# Patient Record
Sex: Female | Born: 1960 | Race: White | Hispanic: No | Marital: Married | Smoking: Never smoker
Health system: Southern US, Community
[De-identification: ages and names within clinical notes are randomized; demographics above are authoritative.]

## PROBLEM LIST (undated history)

## (undated) DIAGNOSIS — L409 Psoriasis, unspecified: Secondary | ICD-10-CM

## (undated) DIAGNOSIS — H32 Chorioretinal disorders in diseases classified elsewhere: Secondary | ICD-10-CM

## (undated) DIAGNOSIS — H8101 Meniere's disease, right ear: Secondary | ICD-10-CM

## (undated) DIAGNOSIS — B399 Histoplasmosis, unspecified: Secondary | ICD-10-CM

## (undated) DIAGNOSIS — H36 Retinal disorders in diseases classified elsewhere: Secondary | ICD-10-CM

## (undated) DIAGNOSIS — Z87442 Personal history of urinary calculi: Secondary | ICD-10-CM

## (undated) DIAGNOSIS — E119 Type 2 diabetes mellitus without complications: Secondary | ICD-10-CM

## (undated) DIAGNOSIS — G43909 Migraine, unspecified, not intractable, without status migrainosus: Secondary | ICD-10-CM

## (undated) DIAGNOSIS — C4490 Unspecified malignant neoplasm of skin, unspecified: Secondary | ICD-10-CM

## (undated) DIAGNOSIS — H3689 Other retinal disorders in diseases classified elsewhere: Secondary | ICD-10-CM

## (undated) DIAGNOSIS — K862 Cyst of pancreas: Secondary | ICD-10-CM

## (undated) HISTORY — DX: Migraine, unspecified, not intractable, without status migrainosus: G43.909

## (undated) HISTORY — DX: Psoriasis, unspecified: L40.9

## (undated) HISTORY — DX: Retinal disorders in diseases classified elsewhere: H36

## (undated) HISTORY — DX: Other retinal disorders in diseases classified elsewhere: H36.89

## (undated) HISTORY — DX: Histoplasmosis, unspecified: B39.9

## (undated) HISTORY — DX: Unspecified malignant neoplasm of skin, unspecified: C44.90

## (undated) HISTORY — DX: Chorioretinal disorders in diseases classified elsewhere: H32

## (undated) HISTORY — DX: Cyst of pancreas: K86.2

## (undated) HISTORY — DX: Personal history of urinary calculi: Z87.442

## (undated) HISTORY — DX: Meniere's disease, right ear: H81.01

## (undated) HISTORY — DX: Type 2 diabetes mellitus without complications: E11.9

---

## 2003-09-20 HISTORY — PX: TONSILLECTOMY: SUR1361

## 2005-03-31 ENCOUNTER — Ambulatory Visit: Payer: Self-pay | Admitting: Unknown Physician Specialty

## 2005-04-11 ENCOUNTER — Ambulatory Visit: Payer: Self-pay | Admitting: Unknown Physician Specialty

## 2006-05-29 ENCOUNTER — Ambulatory Visit: Payer: Self-pay | Admitting: Unknown Physician Specialty

## 2007-03-20 ENCOUNTER — Ambulatory Visit: Payer: Self-pay | Admitting: Unknown Physician Specialty

## 2007-06-18 ENCOUNTER — Ambulatory Visit: Payer: Self-pay | Admitting: Unknown Physician Specialty

## 2008-01-18 HISTORY — PX: DENTAL SURGERY: SHX609

## 2008-07-21 ENCOUNTER — Ambulatory Visit: Payer: Self-pay | Admitting: Unknown Physician Specialty

## 2009-07-27 ENCOUNTER — Ambulatory Visit: Payer: Self-pay | Admitting: Unknown Physician Specialty

## 2009-09-19 HISTORY — PX: DENTAL SURGERY: SHX609

## 2009-11-05 ENCOUNTER — Ambulatory Visit: Payer: Self-pay | Admitting: Internal Medicine

## 2009-12-01 ENCOUNTER — Ambulatory Visit: Payer: Self-pay | Admitting: Otolaryngology

## 2010-08-02 ENCOUNTER — Ambulatory Visit: Payer: Self-pay | Admitting: Unknown Physician Specialty

## 2010-08-05 ENCOUNTER — Ambulatory Visit: Payer: Self-pay | Admitting: Unknown Physician Specialty

## 2011-03-01 ENCOUNTER — Ambulatory Visit: Payer: Self-pay | Admitting: Unknown Physician Specialty

## 2011-04-21 ENCOUNTER — Ambulatory Visit: Payer: Self-pay | Admitting: Unknown Physician Specialty

## 2011-09-28 ENCOUNTER — Ambulatory Visit: Payer: Self-pay | Admitting: Unknown Physician Specialty

## 2011-10-25 ENCOUNTER — Ambulatory Visit: Payer: Self-pay | Admitting: Unknown Physician Specialty

## 2011-12-02 ENCOUNTER — Ambulatory Visit: Payer: Self-pay | Admitting: Gastroenterology

## 2012-06-02 IMAGING — US US RENAL KIDNEY
1 series · 17 of 25 positions shown · non-contrast
Comparison: none

REASON FOR EXAM: FU from ct      showed  cyst on rt kidney
COMMENTS:

[Series 1: us renal kidney · 17 of 31 slices shown]
[im 1/31]
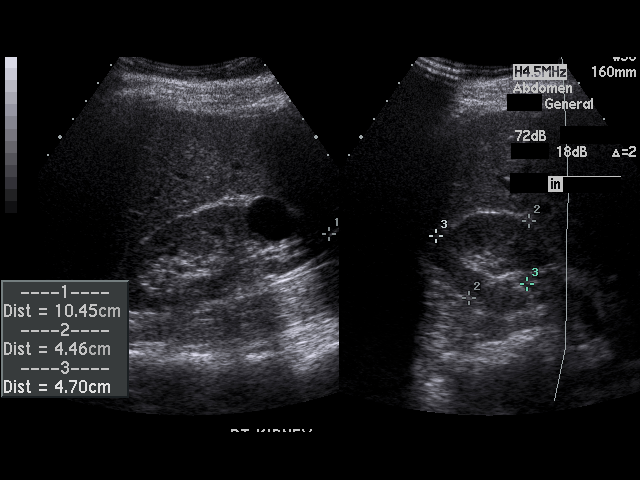
[im 3/31]
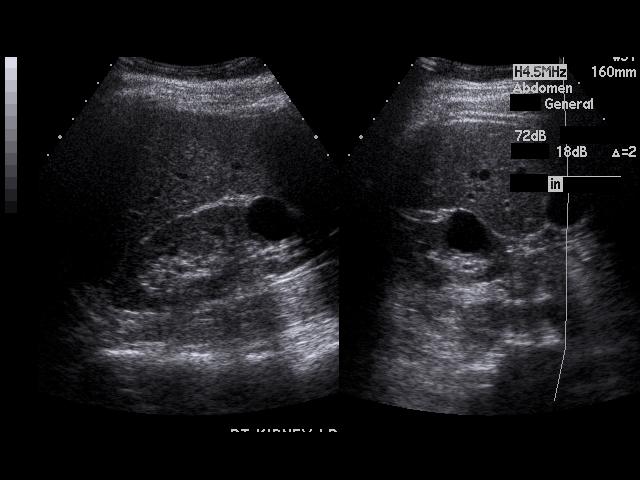
[im 4/31]
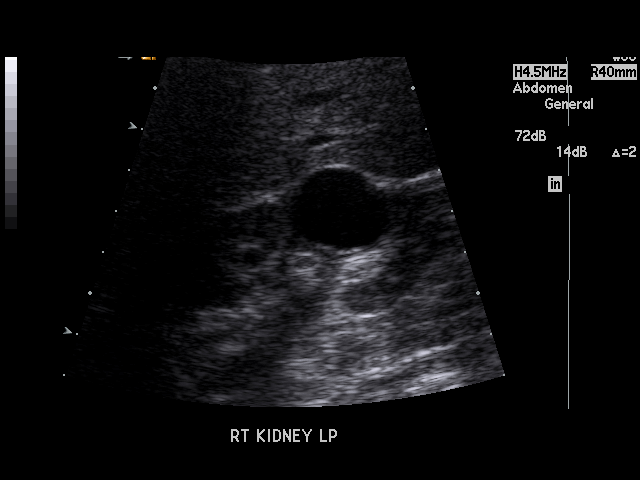
[im 7/31]
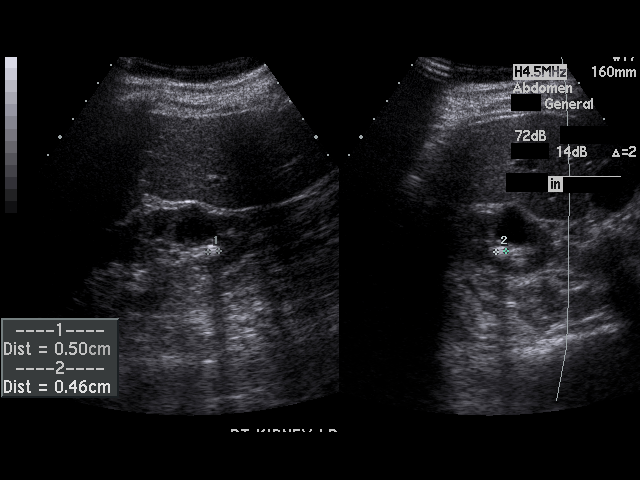
[im 8/31]
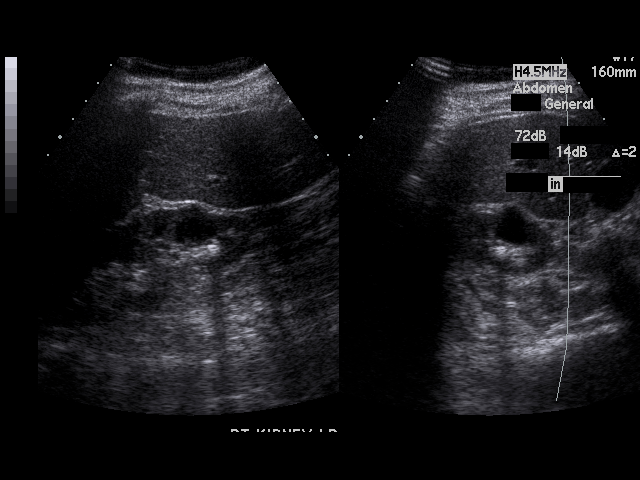
[im 11/31]
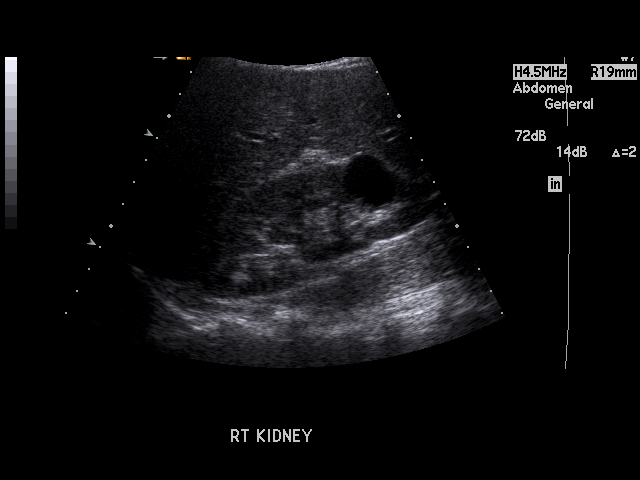
[im 12/31]
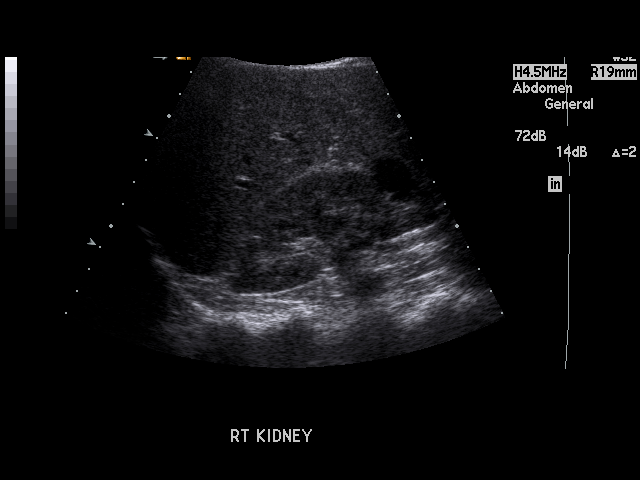
[im 14/31]
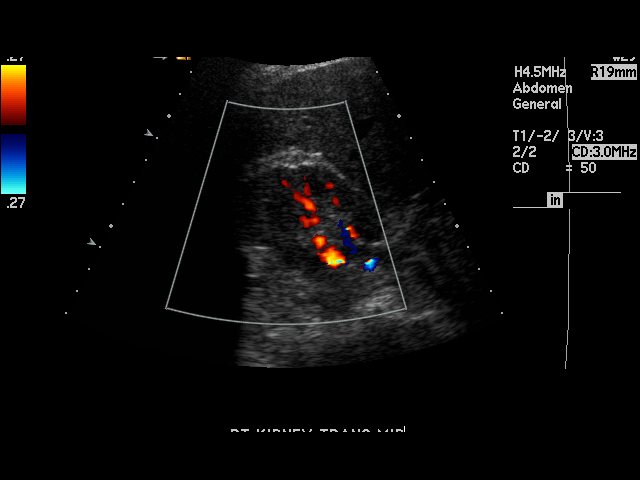
[im 16/31]
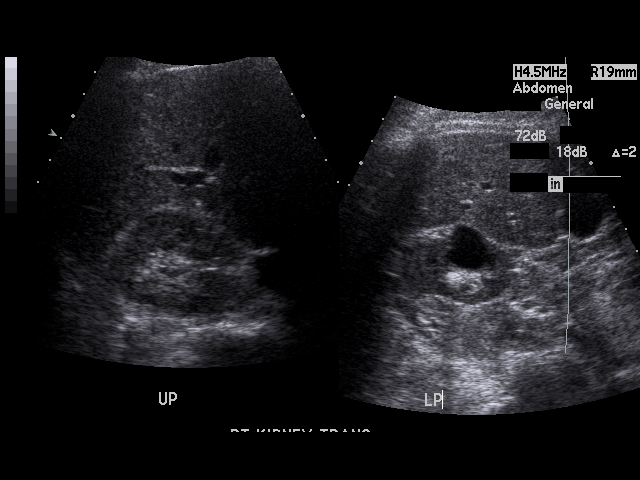
[im 17/31]
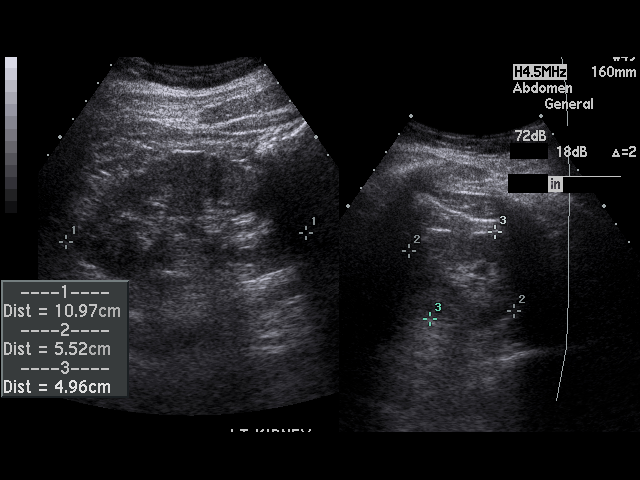
[im 19/31]
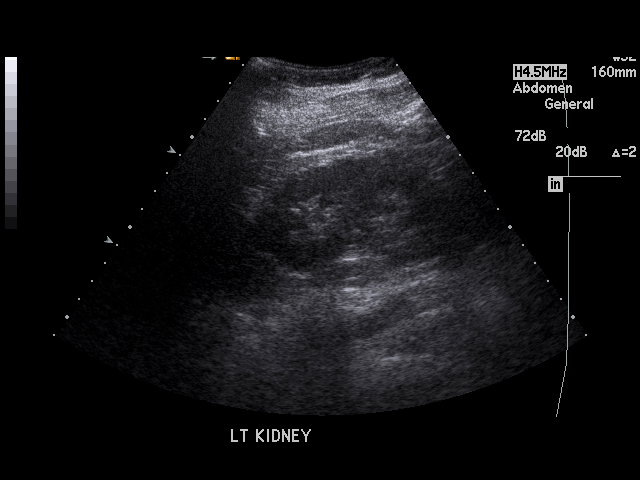
[im 21/31]
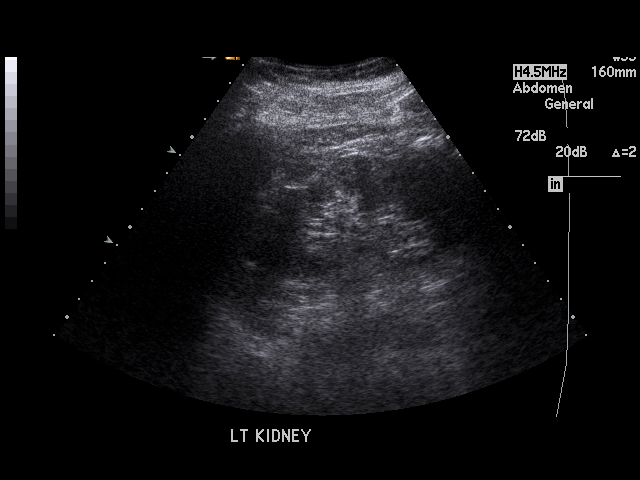
[im 23/31]
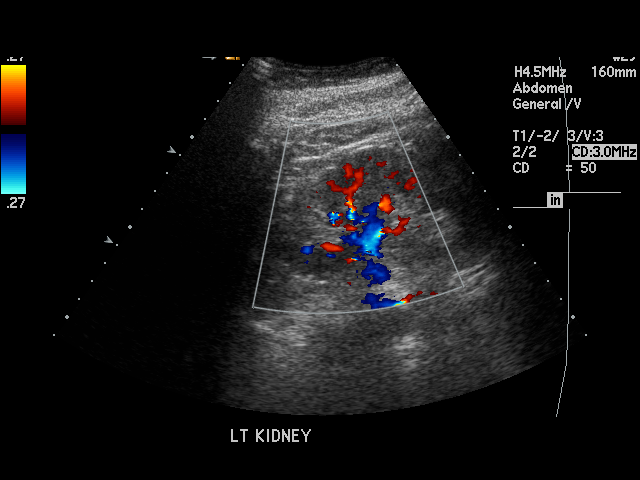
[im 24/31]
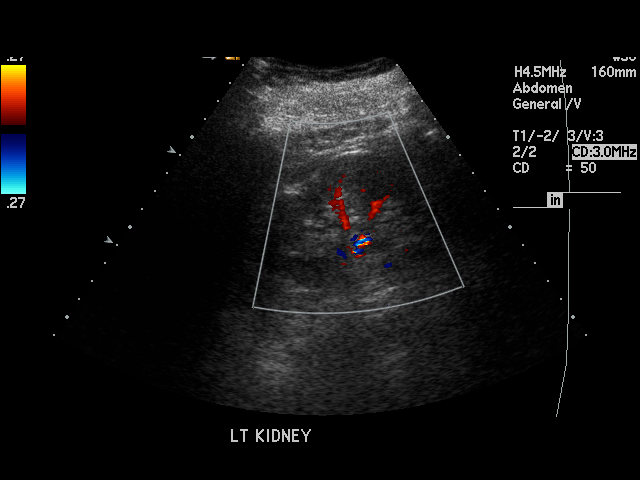
[im 27/31]
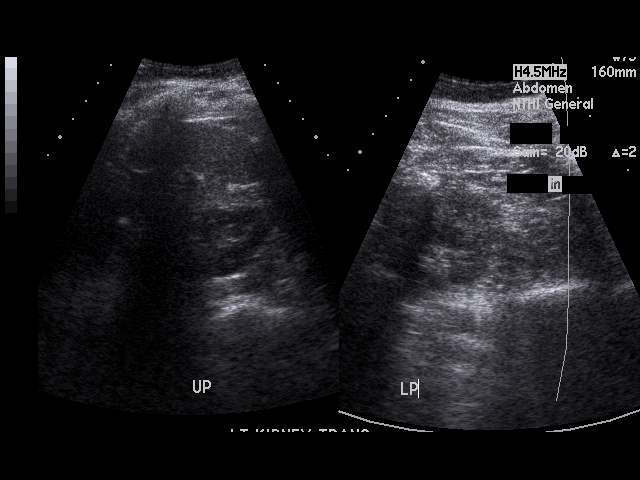
[im 28/31]
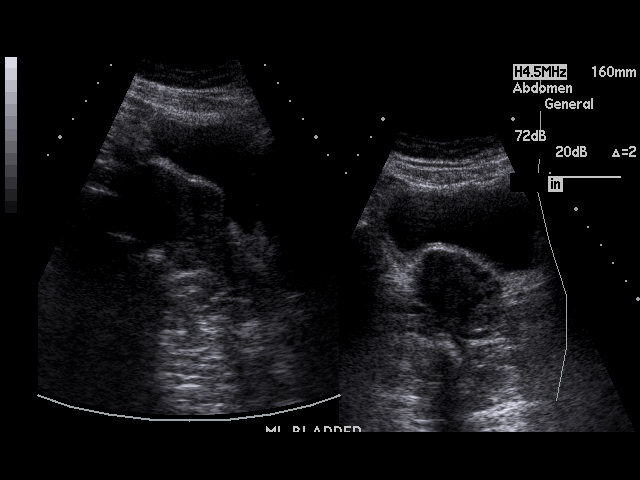
[im 31/31]
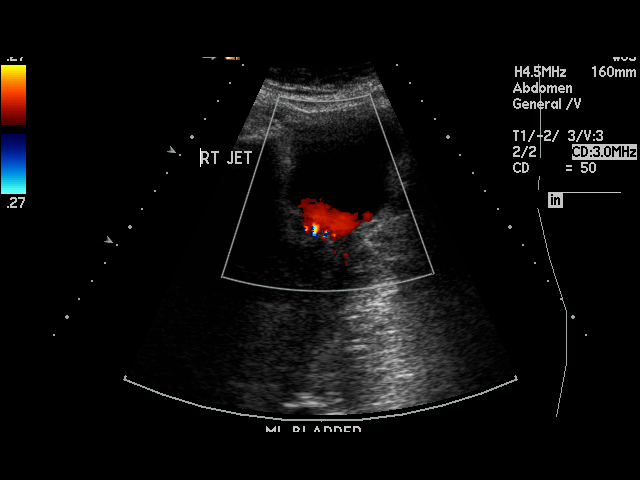

[17 of 25 positions shown; findings below may reference images not displayed]

PROCEDURE:     [facility]  - October 25, 2011  [DATE]

RESULT:     The right kidney measures 10.45 cm x 4.46 cm x 4.7 cm and the
left kidney measures 10.97 cm x 5.52 cm x 4.96 cm. The renal cortical
margins bilaterally are smooth. There is a 2.4 cm cyst at the lower pole of
the right kidney corresponding to the low-attenuating mass observed at prior
CT. No other solid or cystic mass lesions are identified. There is noted a
nonobstructing 5 mm stone at the lower pole of the right kidney. No
hydronephrosis is observed on either side. The visualized portion of the
urinary bladder is normal in appearance. Bilateral ureteral flow jets are
seen.
IMPRESSION: 1. There is a 2.4 cm cyst at the lower pole of the right kidney.
2. There is a 5 mm nonobstructing stone at the lower pole of the right
kidney.
3. Otherwise normal study.

## 2012-07-02 ENCOUNTER — Ambulatory Visit: Payer: Self-pay | Admitting: Family Medicine

## 2012-10-03 ENCOUNTER — Ambulatory Visit: Payer: Self-pay | Admitting: Family Medicine

## 2013-01-01 ENCOUNTER — Ambulatory Visit: Payer: Self-pay | Admitting: Otolaryngology

## 2013-02-18 ENCOUNTER — Ambulatory Visit: Payer: Self-pay

## 2013-02-18 ENCOUNTER — Emergency Department: Payer: Self-pay | Admitting: Emergency Medicine

## 2013-02-18 LAB — COMPREHENSIVE METABOLIC PANEL
Albumin: 4 g/dL (ref 3.4–5.0)
Alkaline Phosphatase: 118 U/L (ref 50–136)
BUN: 18 mg/dL (ref 7–18)
Calcium, Total: 9 mg/dL (ref 8.5–10.1)
Chloride: 102 mmol/L (ref 98–107)
Co2: 30 mmol/L (ref 21–32)
Creatinine: 0.75 mg/dL (ref 0.60–1.30)
EGFR (African American): >60
EGFR (Non-African Amer.): >60
Glucose: 126 mg/dL — ABNORMAL HIGH (ref 65–99)
Osmolality: 279 (ref 275–301)
SGPT (ALT): 27 U/L (ref 12–78)
Sodium: 138 mmol/L (ref 136–145)

## 2013-02-18 LAB — URINALYSIS, COMPLETE
Bilirubin,UR: NEGATIVE
Ketone: NEGATIVE
Ketone: NEGATIVE
Leukocyte Esterase: NEGATIVE
Nitrite: NEGATIVE
Nitrite: POSITIVE
Ph: 5 (ref 4.5–8.0)
Protein: 30
Protein: NEGATIVE
RBC,UR: 101 /HPF (ref 0–5)
Specific Gravity: 1.012 (ref 1.003–1.030)
Squamous Epithelial: NONE SEEN
WBC UR: 5 /HPF (ref 0–5)

## 2013-02-18 LAB — CBC
MCH: 30.2 pg (ref 26.0–34.0)
MCHC: 33.2 g/dL (ref 32.0–36.0)
MCV: 91 fL (ref 80–100)
RDW: 13.3 % (ref 11.5–14.5)
WBC: 8.4 10*3/uL (ref 3.6–11.0)

## 2013-09-02 ENCOUNTER — Ambulatory Visit: Payer: Self-pay | Admitting: Family Medicine

## 2013-09-02 DIAGNOSIS — R21 Rash and other nonspecific skin eruption: Secondary | ICD-10-CM | POA: Insufficient documentation

## 2013-10-08 ENCOUNTER — Ambulatory Visit: Payer: Self-pay | Admitting: Family Medicine

## 2013-10-08 ENCOUNTER — Other Ambulatory Visit: Payer: Self-pay | Admitting: *Deleted

## 2013-10-08 DIAGNOSIS — M79609 Pain in unspecified limb: Secondary | ICD-10-CM

## 2013-10-08 DIAGNOSIS — I83893 Varicose veins of bilateral lower extremities with other complications: Secondary | ICD-10-CM

## 2013-10-22 ENCOUNTER — Encounter (HOSPITAL_COMMUNITY): Payer: BC Managed Care – PPO

## 2013-10-22 ENCOUNTER — Encounter: Payer: BC Managed Care – PPO | Admitting: Vascular Surgery

## 2014-05-17 IMAGING — MG MM DIGITAL SCREENING BILAT W/ CAD
1 series · 4 of 4 positions shown · non-contrast
Comparison: Previous exam(s).

CLINICAL DATA: Screening.

EXAM:
DIGITAL SCREENING BILATERAL MAMMOGRAM WITH CAD

[R CC · right · 4 of 4 slices shown]
[im 1/4]
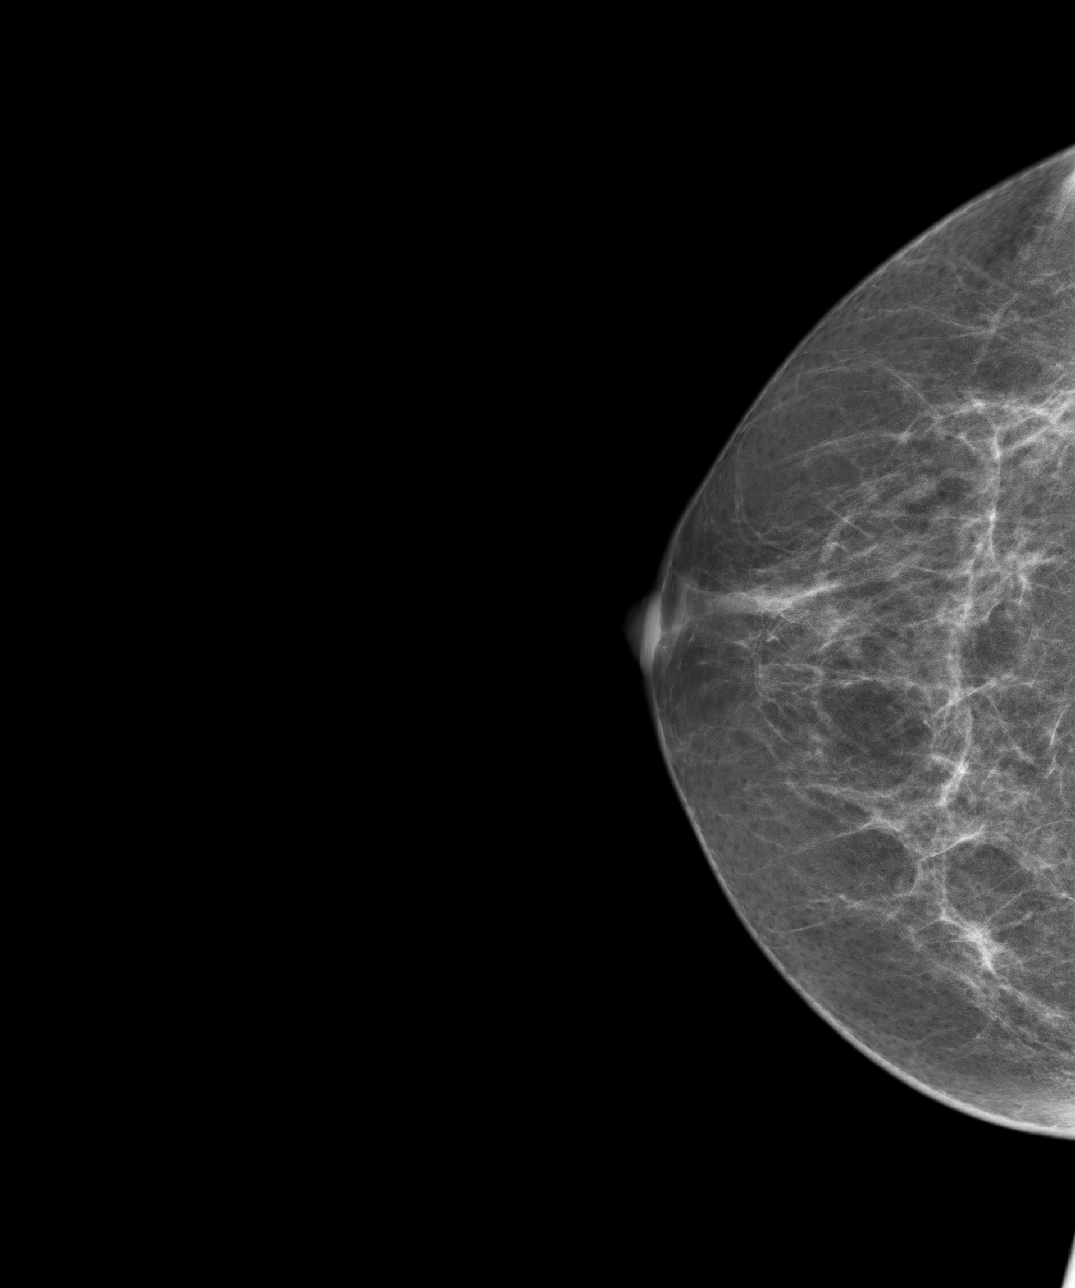
[im 2/4]
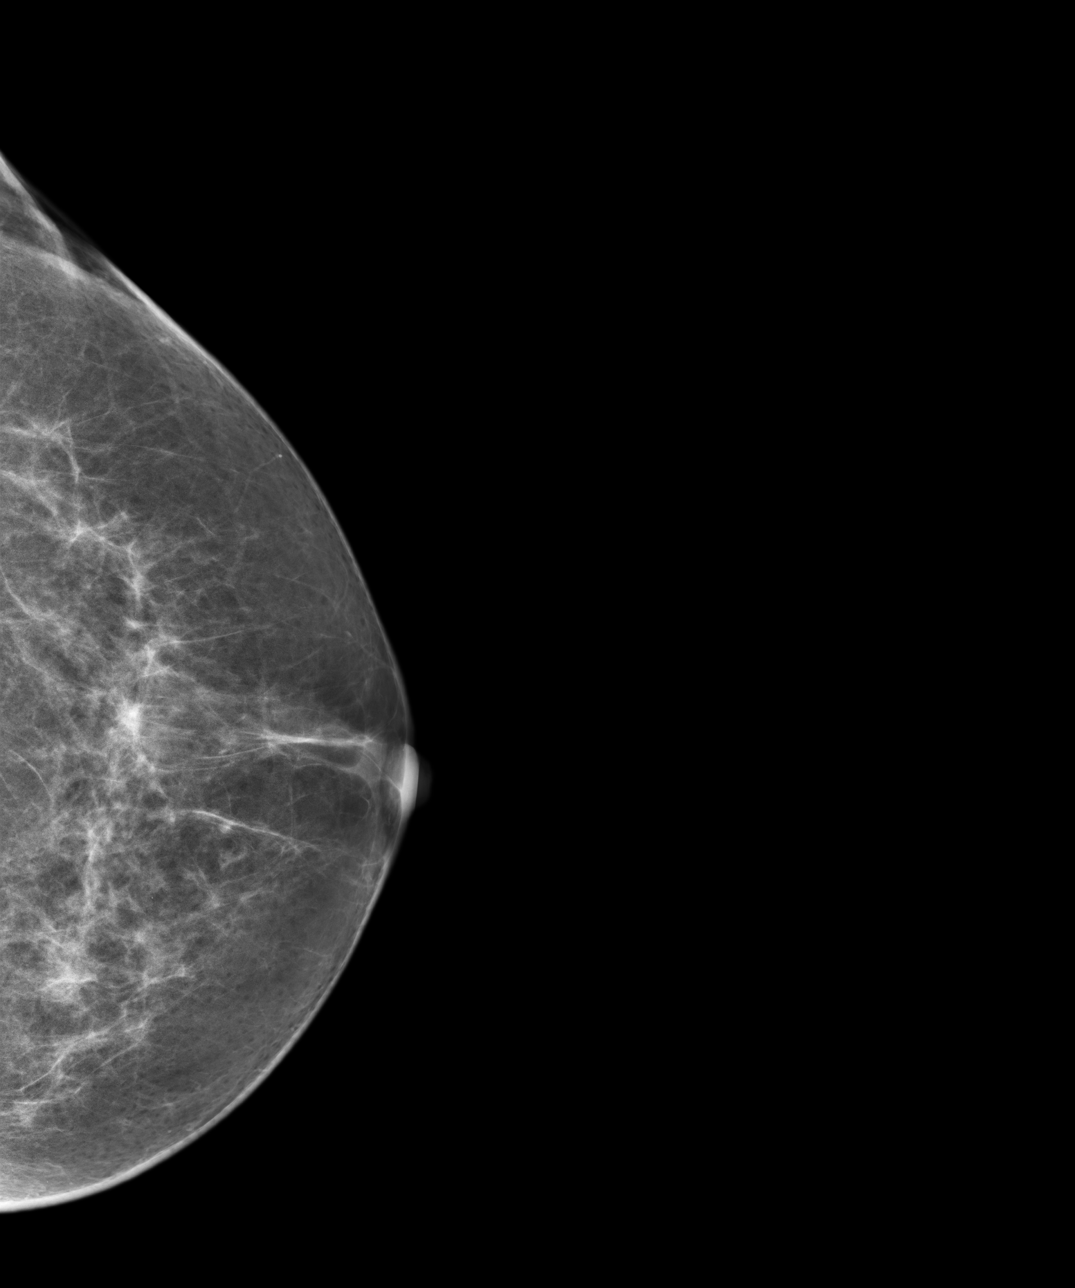
[im 3/4]
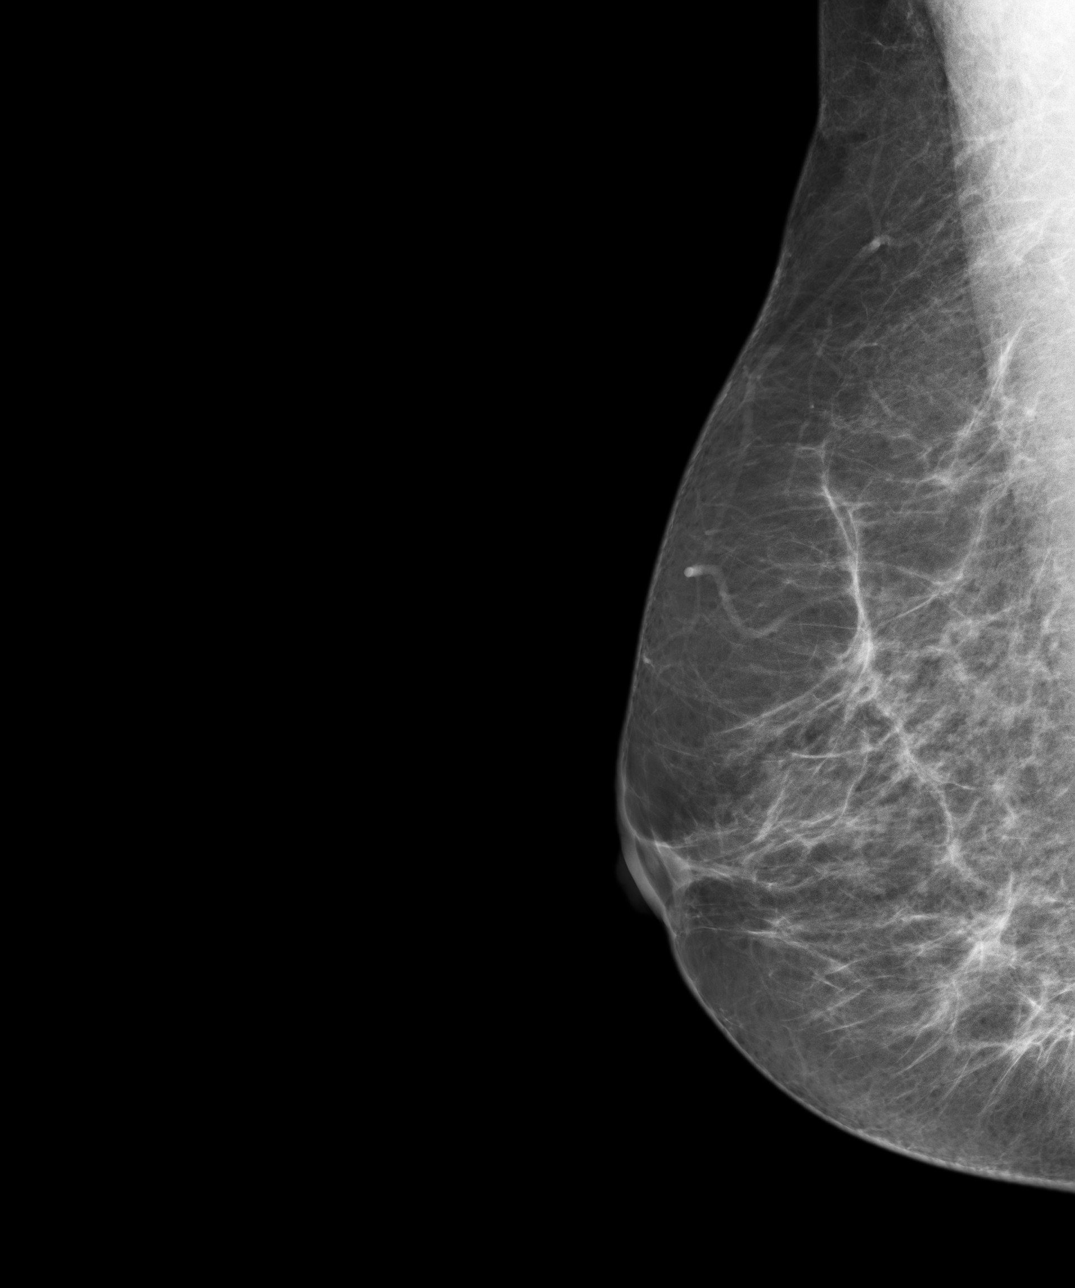
[im 4/4]
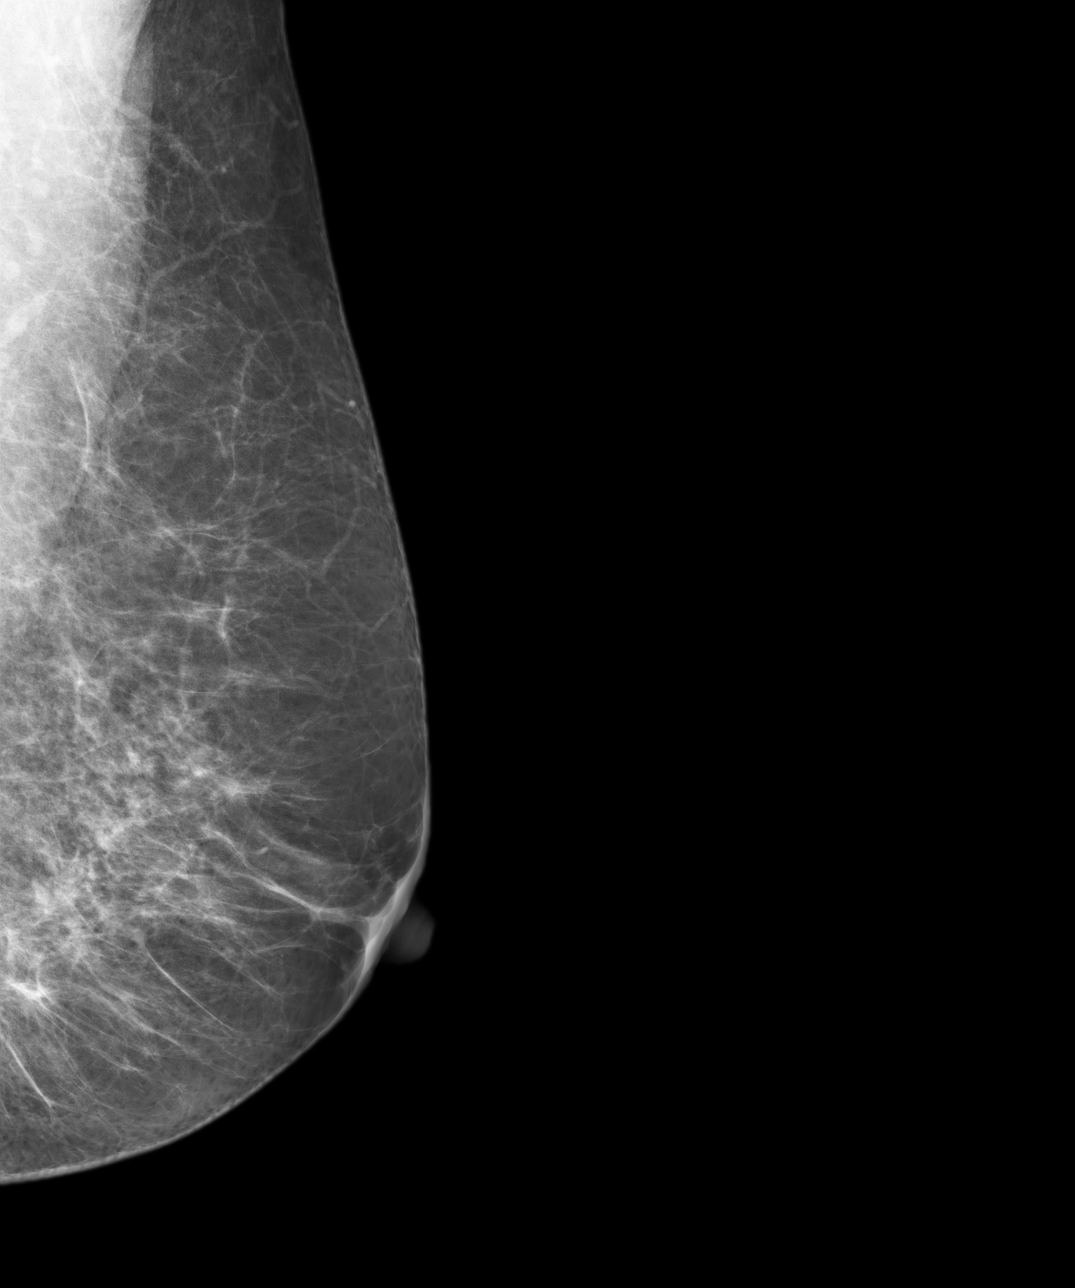

[4 of 4 positions shown; findings below may reference images not displayed]

ACR Breast Density Category b: There are scattered areas of
fibroglandular density.
FINDINGS: There are no findings suspicious for malignancy. Images were
processed with CAD.
IMPRESSION: No mammographic evidence of malignancy. A result letter of this
screening mammogram will be mailed directly to the patient.

RECOMMENDATION:
Screening mammogram in one year. (Code:AS-G-LCT)

BI-RADS CATEGORY  1: Negative.

## 2014-09-26 DIAGNOSIS — E7800 Pure hypercholesterolemia, unspecified: Secondary | ICD-10-CM | POA: Insufficient documentation

## 2014-09-30 DIAGNOSIS — R748 Abnormal levels of other serum enzymes: Secondary | ICD-10-CM | POA: Insufficient documentation

## 2014-10-09 ENCOUNTER — Ambulatory Visit: Payer: Self-pay | Admitting: Family Medicine

## 2014-10-29 DIAGNOSIS — K862 Cyst of pancreas: Secondary | ICD-10-CM | POA: Insufficient documentation

## 2015-03-04 ENCOUNTER — Inpatient Hospital Stay: Payer: BLUE CROSS/BLUE SHIELD

## 2015-03-04 ENCOUNTER — Inpatient Hospital Stay: Payer: BLUE CROSS/BLUE SHIELD | Attending: Hematology and Oncology | Admitting: Hematology and Oncology

## 2015-03-04 ENCOUNTER — Encounter: Payer: Self-pay | Admitting: Hematology and Oncology

## 2015-03-04 VITALS — BP 149/67 | HR 64 | Temp 97.9°F | Resp 16 | Ht 64.37 in | Wt 121.3 lb

## 2015-03-04 DIAGNOSIS — N281 Cyst of kidney, acquired: Secondary | ICD-10-CM | POA: Insufficient documentation

## 2015-03-04 DIAGNOSIS — R109 Unspecified abdominal pain: Secondary | ICD-10-CM

## 2015-03-04 DIAGNOSIS — L409 Psoriasis, unspecified: Secondary | ICD-10-CM | POA: Insufficient documentation

## 2015-03-04 DIAGNOSIS — R599 Enlarged lymph nodes, unspecified: Secondary | ICD-10-CM | POA: Insufficient documentation

## 2015-03-04 DIAGNOSIS — C801 Malignant (primary) neoplasm, unspecified: Secondary | ICD-10-CM | POA: Insufficient documentation

## 2015-03-04 DIAGNOSIS — R945 Abnormal results of liver function studies: Secondary | ICD-10-CM

## 2015-03-04 DIAGNOSIS — R61 Generalized hyperhidrosis: Secondary | ICD-10-CM | POA: Diagnosis not present

## 2015-03-04 DIAGNOSIS — B078 Other viral warts: Secondary | ICD-10-CM

## 2015-03-04 DIAGNOSIS — Z85828 Personal history of other malignant neoplasm of skin: Secondary | ICD-10-CM | POA: Diagnosis not present

## 2015-03-04 DIAGNOSIS — H81319 Aural vertigo, unspecified ear: Secondary | ICD-10-CM | POA: Insufficient documentation

## 2015-03-04 DIAGNOSIS — Z79899 Other long term (current) drug therapy: Secondary | ICD-10-CM | POA: Insufficient documentation

## 2015-03-04 DIAGNOSIS — Z87440 Personal history of urinary (tract) infections: Secondary | ICD-10-CM | POA: Insufficient documentation

## 2015-03-04 DIAGNOSIS — Z803 Family history of malignant neoplasm of breast: Secondary | ICD-10-CM | POA: Diagnosis not present

## 2015-03-04 DIAGNOSIS — H32 Chorioretinal disorders in diseases classified elsewhere: Secondary | ICD-10-CM

## 2015-03-04 DIAGNOSIS — B399 Histoplasmosis, unspecified: Secondary | ICD-10-CM

## 2015-03-04 DIAGNOSIS — H8101 Meniere's disease, right ear: Secondary | ICD-10-CM

## 2015-03-04 DIAGNOSIS — Z7982 Long term (current) use of aspirin: Secondary | ICD-10-CM | POA: Diagnosis not present

## 2015-03-04 DIAGNOSIS — Z809 Family history of malignant neoplasm, unspecified: Secondary | ICD-10-CM | POA: Diagnosis not present

## 2015-03-04 DIAGNOSIS — K219 Gastro-esophageal reflux disease without esophagitis: Secondary | ICD-10-CM | POA: Insufficient documentation

## 2015-03-04 DIAGNOSIS — I7300 Raynaud's syndrome without gangrene: Secondary | ICD-10-CM | POA: Insufficient documentation

## 2015-03-04 DIAGNOSIS — R590 Localized enlarged lymph nodes: Secondary | ICD-10-CM

## 2015-03-04 LAB — LACTATE DEHYDROGENASE: LDH: 174 U/L (ref 98–192)

## 2015-03-04 LAB — URIC ACID: Uric Acid, Serum: 5.1 mg/dL (ref 2.3–6.6)

## 2015-03-04 NOTE — Progress Notes (Signed)
[facility] day:  03/04/2015  Chief Complaint: Beverly Alvarez is a 54 y.o. female who is referred in consultation by Dr. Gayland Curry for assessment of adenopathy.   HPI: The patient notes a history of waxing and waning axillary adenopathy. She is unclear if is related to anything. She states that she would break out then stopped shaving. Sometimes it would get better,  but "not necessarily".  The patient notes an outbreak of flat warts on the left lower leg in 02/2014.  She states that it reminded the dermatologist of HIV-positive patients. She has had cryotherapy several times.   She alo notes random warts on her legs and arms. She states that she is trying not to use the shaver her to minimize spread. Currently her lesions are better.   She denies any history of gingivitis, sinusitis or abscesses. She sweats a lot at night and in the day possibly related to hot flashes. They are typically not drenching. At night when she gets hot, she stands in front of the open refrigerator door. Her weight is down 12 pounds intentionally since January secondary to a change in diet.  She states that last year HIV testing was negative. Hepatitis B and C testing have been negative in the past.  Labs on 02/10/2015 included a hematocrit of 38.6, hemoglobin 12.5, MCV 93, platelets 344,000, and white count 8200 with an ANC of 4300.  Symptomatically, she feels well.  She notes chronic right-sided pain for years.  She is seen by a "liver and pancreas specialist".  Pain is lower down on the the right side. She was recently on antibiotics for urinary tract infection. Her alkaline phosphatase is elevated (1/2 liver and 1/2 bone fractionation per patient).  Past Medical History  Diagnosis Date  . Skin cancer Basal Cell Carcinoma    to forehead and right thigh in 06/2003  . Migraine   . Retinal histoplasmosis   . Meniere's disease of right ear   . Psoriasis      Past Surgical History  Procedure Laterality Date  . Tonsillectomy  09/2003  . Dental surgery  01/2008    Gum Graft  . Dental surgery  2011    Dental Implant    Family History  Problem Relation Age of Onset  . Cancer Father     Brain Cancer ( possible lung or pancreatic origin)  . Cancer Sister 58    Breast Cancer    Social History:  reports that she has never smoked. She does not have any smokeless tobacco history on file. She reports that she drinks about 1.2 oz of alcohol per week. Her drug history is not on file.  The patient is alone today.  Allergies: No Known Allergies  Current Medications: Current Outpatient Prescriptions  Medication Sig Dispense Refill  . aspirin EC 81 MG tablet Take by mouth.    . Omega-3 Fatty Acids (FISH OIL) 1000 MG CAPS Take by mouth.    . OTEZLA 30 MG TABS   11  . oxiconazole (OXISTAT) 1 % lotion Apply topically.    . triamterene-hydrochlorothiazide (MAXZIDE-25) 37.5-25 MG per tablet   3  . vitamin C (ASCORBIC ACID) 500 MG tablet Take 500 mg by mouth daily.     No current facility-administered medications for this visit.    Review of Systems:  GENERAL:  Feels good.  Active.  No fevers, sweats or weight loss. PERFORMANCE STATUS (ECOG): 1 HEENT:  Ocular histoplasmosis.  Sinus  drainage. No runny nose, sore throat, mouth sores or tenderness. Lungs: No shortness of breath or cough.  No hemoptysis. Cardiac:  No chest pain, palpitations, orthopnea, or PND. GI:  Chronic right sided pain.  No nausea, vomiting, diarrhea, constipation, melena or hematochezia. GU:  No urgency, frequency, dysuria, or hematuria. Musculoskeletal:  No back pain.  No joint pain.  No muscle tenderness. Extremities:  No pain or swelling. Skin:  No rashes or skin changes. Neuro:  No headache, numbness or weakness, balance or coordination issues. Endocrine:  No diabetes, thyroid issues, hot flashes or night sweats. Psych:  No mood changes, depression or anxiety. Pain:  No  focal pain. Review of systems:  All other systems reviewed and found to be negative.   Physical Exam: Blood pressure 149/67, pulse 64, temperature 97.9 F (36.6 C), temperature source Tympanic, resp. rate 16, height 5' 4.37" (1.635 m), weight 121 lb 4.1 oz (55 kg), SpO2 100 %. GENERAL:  Well developed, well nourished woman sitting comfortably in the exam room in a tennis outfit in no acute distress. MENTAL STATUS:  Alert and oriented to person, place and time. HEAD:  Shoulder length hair.  Normocephalic, atraumatic, face symmetric, no Cushingoid features. EYES:  Blue eyes.  Pupils equal round and reactive to light and accomodation.  No conjunctivitis or scleral icterus. ENT:  Oropharynx clear without lesion.  Tongue normal. Mucous membranes moist.  RESPIRATORY:  Clear to auscultation without rales, wheezes or rhonchi. CARDIOVASCULAR:  Regular rate and rhythm without murmur, rub or gallop. ABDOMEN:  Soft, non-tender, with active bowel sounds, and no hepatosplenomegaly.  No masses. SKIN:  No rashes, ulcers or lesions. EXTREMITIES: No edema, no skin discoloration or tenderness.  No palpable cords. LYMPH NODES: Tender bilateral axillary area without associated adenopathy.  No palpable cervical, supraclavicular, axillary or inguinal adenopathy  NEUROLOGICAL: Unremarkable. PSYCH:  Appropriate.   Appointment on 03/04/2015  Component Date Value Ref Range Status  . Total Protein ELP 03/04/2015 7.2  6.0 - 8.5 g/dL Final  . Albumin ELP 03/04/2015 4.1  2.9 - 4.4 g/dL Final                 **Please note reference interval change**  . Alpha-1-Globulin 03/04/2015 0.2  0.0 - 0.4 g/dL Final                 **Please note reference interval change**  . Alpha-2-Globulin 03/04/2015 0.8  0.4 - 1.0 g/dL Final                 **Please note reference interval change**  . Beta Globulin 03/04/2015 1.2  0.7 - 1.3 g/dL Final                 **Please note reference interval change**  . Gamma Globulin 03/04/2015 0.9   0.4 - 1.8 g/dL Final                 **Please note reference interval change**  . M-Spike, % 03/04/2015 Not Observed  Not Observed g/dL Final  . SPE Interp. 03/04/2015 Comment   Final   Comment: (NOTE) The SPE pattern appears essentially unremarkable. Evidence of monoclonal protein is not apparent. Performed At: [facility] [place], [place] [id number] Lindon Romp MD [id number]   . Comment 03/04/2015 Comment   Final   Comment: (NOTE) Protein electrophoresis scan will follow via computer, mail, or courier delivery.   Marland Kitchen GLOBULIN, TOTAL 03/04/2015 3.1  2.2 - 3.9 g/dL Corrected                 **  Please note reference interval change**  . A/G Ratio 03/04/2015 1.3  0.7 - 1.7 Corrected                 **Please note reference interval change**  . IgG (Immunoglobin G), Serum 03/04/2015 810  700 - 1600 mg/dL Final  . IgA 03/04/2015 169  87 - 352 mg/dL Final  . IgM, Serum 03/04/2015 86  26 - 217 mg/dL Final   Comment: (NOTE) Performed At: [facility] [place], [place] [id number] Lindon Romp MD [id number]   . LDH 03/04/2015 174  98 - 192 U/L Final  . Anit Nuclear Antibody(ANA) 03/04/2015 Negative  Negative Final   Comment: (NOTE) Performed At: [facility] [place], [place] [id number] Lindon Romp MD [id number]   Office Visit on 03/04/2015  Component Date Value Ref Range Status  . Uric Acid, Serum 03/04/2015 5.1  2.3 - 6.6 mg/dL Final  . Compl, Total (CH50) 03/04/2015 > 60  42 - 60 U/mL Final   Comment: (NOTE) **Results verified by repeat testing** Performed At: [facility] [place], [place] [id number] Lindon Romp MD [id number]     Assessment:  Beverly Alvarez is a 54 y.o. female with probable reactive axillary adenopathy.  She may have periodic mild hidradenitis suppurativa.  She has had unusual flat wart outbreaks involving the left lower extremity.  HIV  testing has been negative in the past.  She has vasomotor symptoms. She has had a 10-12 pound intentional weight loss in the past 6 months.   She feels well.  She is up to date on her mammogram.  She denies any breast concerns.  Exam reveals no adenopathy or hepatosplenomegaly.  Plan: 1. Discuss etiology of adenopathy likely reactive in nature.  Typically patients have recurrent sinopulmonary infections with defects in humoral immunity.  Recurrent viral infections are seen in defects in cellular function (T-cell).  Fungal infections are noted primarily in phagocytic deficiencies. Given the waxing and waning nature of her axillary adenopathy, without other serious infections, I doubt an immune disorder or lymphoma.  I discussed HIV testing (previously done), screening for lymphoma (doubt), and serum immunoglobulins. 2. Labs today:  Serum protein electrophoresis, immunoglobulins, LDH, uric acid, complement, and ANA with reflex. 3. Return to clinic in 1-2 weeks to review testing.   Lequita Asal, MD  03/04/2015, 4:58 PM

## 2015-03-05 LAB — PROTEIN ELECTROPHORESIS, SERUM
A/G Ratio: 1.3 (ref 0.7–1.7)
Albumin ELP: 4.1 g/dL (ref 2.9–4.4)
Alpha-1-Globulin: 0.2 g/dL (ref 0.0–0.4)
Alpha-2-Globulin: 0.8 g/dL (ref 0.4–1.0)
Beta Globulin: 1.2 g/dL (ref 0.7–1.3)
Gamma Globulin: 0.9 g/dL (ref 0.4–1.8)
Globulin, Total: 3.1 g/dL (ref 2.2–3.9)
Total Protein ELP: 7.2 g/dL (ref 6.0–8.5)

## 2015-03-05 LAB — IGG, IGA, IGM
IgA: 169 mg/dL (ref 87–352)
IgG (Immunoglobin G), Serum: 810 mg/dL (ref 700–1600)
IgM, Serum: 86 mg/dL (ref 26–217)

## 2015-03-05 LAB — ANA W/REFLEX IF POSITIVE: Anti Nuclear Antibody(ANA): NEGATIVE

## 2015-03-05 LAB — COMPLEMENT, TOTAL: Compl, Total (CH50): >60 U/mL (ref 42–60)

## 2015-03-08 DIAGNOSIS — R590 Localized enlarged lymph nodes: Secondary | ICD-10-CM | POA: Insufficient documentation

## 2015-03-09 ENCOUNTER — Other Ambulatory Visit: Payer: Self-pay | Admitting: *Deleted

## 2015-03-09 ENCOUNTER — Inpatient Hospital Stay: Payer: BLUE CROSS/BLUE SHIELD

## 2015-03-09 DIAGNOSIS — R599 Enlarged lymph nodes, unspecified: Secondary | ICD-10-CM

## 2015-03-09 DIAGNOSIS — R591 Generalized enlarged lymph nodes: Secondary | ICD-10-CM

## 2015-03-13 LAB — HELPER T-LYMPH-CD4 (ARMC ONLY)
% CD 4 Pos. Lymph.: 38 % (ref 30.8–58.5)
Absolute CD 4 Helper: 1026 /uL (ref 359–1519)
Basophils Absolute: 0 10*3/uL (ref 0.0–0.2)
Basos: 0 %
EOS (ABSOLUTE): 0.4 10*3/uL (ref 0.0–0.4)
Eos: 5 %
Hematocrit: 37.2 % (ref 34.0–46.6)
Hemoglobin: 12.5 g/dL (ref 11.1–15.9)
Immature Grans (Abs): 0 10*3/uL (ref 0.0–0.1)
Immature Granulocytes: 0 %
Lymphocytes Absolute: 2.7 10*3/uL (ref 0.7–3.1)
Lymphs: 35 %
MCH: 30.3 pg (ref 26.6–33.0)
MCHC: 33.6 g/dL (ref 31.5–35.7)
MCV: 90 fL (ref 79–97)
Monocytes Absolute: 0.5 10*3/uL (ref 0.1–0.9)
Monocytes: 7 %
Neutrophils Absolute: 4.1 10*3/uL (ref 1.4–7.0)
Neutrophils: 53 %
Platelets: 339 10*3/uL (ref 150–379)
RBC: 4.12 x10E6/uL (ref 3.77–5.28)
RDW: 13.7 % (ref 12.3–15.4)
WBC: 7.7 10*3/uL (ref 3.4–10.8)

## 2015-03-18 ENCOUNTER — Inpatient Hospital Stay (HOSPITAL_BASED_OUTPATIENT_CLINIC_OR_DEPARTMENT_OTHER): Payer: BLUE CROSS/BLUE SHIELD | Admitting: Hematology and Oncology

## 2015-03-18 ENCOUNTER — Encounter: Payer: Self-pay | Admitting: Hematology and Oncology

## 2015-03-18 ENCOUNTER — Other Ambulatory Visit: Payer: Self-pay

## 2015-03-18 VITALS — BP 141/65 | HR 73 | Temp 98.2°F | Ht 63.0 in | Wt 120.2 lb

## 2015-03-18 DIAGNOSIS — R599 Enlarged lymph nodes, unspecified: Secondary | ICD-10-CM

## 2015-03-18 DIAGNOSIS — Z85828 Personal history of other malignant neoplasm of skin: Secondary | ICD-10-CM

## 2015-03-18 DIAGNOSIS — Z87440 Personal history of urinary (tract) infections: Secondary | ICD-10-CM

## 2015-03-18 DIAGNOSIS — H8101 Meniere's disease, right ear: Secondary | ICD-10-CM

## 2015-03-18 DIAGNOSIS — L409 Psoriasis, unspecified: Secondary | ICD-10-CM

## 2015-03-18 DIAGNOSIS — Z7982 Long term (current) use of aspirin: Secondary | ICD-10-CM

## 2015-03-18 DIAGNOSIS — R590 Localized enlarged lymph nodes: Secondary | ICD-10-CM

## 2015-03-18 DIAGNOSIS — B078 Other viral warts: Secondary | ICD-10-CM

## 2015-03-18 DIAGNOSIS — B399 Histoplasmosis, unspecified: Secondary | ICD-10-CM

## 2015-03-18 DIAGNOSIS — R61 Generalized hyperhidrosis: Secondary | ICD-10-CM | POA: Diagnosis not present

## 2015-03-18 DIAGNOSIS — Z809 Family history of malignant neoplasm, unspecified: Secondary | ICD-10-CM

## 2015-03-18 DIAGNOSIS — R945 Abnormal results of liver function studies: Secondary | ICD-10-CM

## 2015-03-18 DIAGNOSIS — R109 Unspecified abdominal pain: Secondary | ICD-10-CM

## 2015-03-18 DIAGNOSIS — Z803 Family history of malignant neoplasm of breast: Secondary | ICD-10-CM

## 2015-03-18 DIAGNOSIS — Z79899 Other long term (current) drug therapy: Secondary | ICD-10-CM

## 2015-03-18 NOTE — Progress Notes (Signed)
[facility] day:  03/04/2015  Chief Complaint: Beverly Alvarez is a 54 y.o. female with a history of waxing and waning axillary adenopathy who is seen for review of work-up and discussion regarding direction of therapy.   HPI:  The patient was last seen in the medical oncology clinic on 03/04/2015.  At that time, she was seen in consultation for axillary adenopathy.  She has a history of unusual flat wart outbreaks involving the left lower extremity.  HIV testing on 09/24/2014 was negative.  She had vasomotor symptoms. She had a 10-12 pound intentional weight loss in the past 6 months.   Exam revealed no palpable adenopathy.  CBC with diff on 02/10/2015 revealed a hematocrit of 38.6, hemoglobin 12.5, MCV 93, platelets 344,000, and white count 8200 with an ANC of 4300.  Labs on 03/04/2015 included a normal LDH, uric acid, complement, and ANA.  CD4 count was normal.  SPEP revealed no monoclonal protein.  Serum immunoglobulins were normal.  Symptomatically, she feels well.  She continues to have vasomotor symptoms.  She denies any drenching night sweats or after bath pruritus.  She has no palpable adenopathy.  Past Medical History  Diagnosis Date  . Skin cancer Basal Cell Carcinoma    to forehead and right thigh in 06/2003  . Migraine   . Retinal histoplasmosis   . Meniere's disease of right ear   . Psoriasis     Past Surgical History  Procedure Laterality Date  . Tonsillectomy  09/2003  . Dental surgery  01/2008    Gum Graft  . Dental surgery  2011    Dental Implant    Family History  Problem Relation Age of Onset  . Cancer Father     Brain Cancer ( possible lung or pancreatic origin)  . Cancer Sister 23    Breast Cancer    Social History:  reports that she has never smoked. She does not have any smokeless tobacco history on file. She reports that she drinks about 1.2 oz of alcohol per week. Her drug history is not on file.  The  patient is alone today.  Allergies: No Known Allergies  Current Medications: Current Outpatient Prescriptions  Medication Sig Dispense Refill  . aspirin EC 81 MG tablet Take by mouth.    . Omega-3 Fatty Acids (FISH OIL) 1000 MG CAPS Take by mouth.    . OTEZLA 30 MG TABS   11  . oxiconazole (OXISTAT) 1 % lotion Apply topically.    . triamterene-hydrochlorothiazide (MAXZIDE-25) 37.5-25 MG per tablet   3  . vitamin C (ASCORBIC ACID) 500 MG tablet Take 500 mg by mouth daily.     No current facility-administered medications for this visit.   Review of Systems:  GENERAL:  Feels good.  Active.  No fevers, sweats or weight loss. PERFORMANCE STATUS (ECOG): 1 HEENT:  Ocular histoplasmosis.  Sinus drainage. No runny nose, sore throat, mouth sores or tenderness. Lungs: No shortness of breath or cough.  No hemoptysis. Cardiac:  No chest pain, palpitations, orthopnea, or PND. GI:  Chronic right sided pain.  No nausea, vomiting, diarrhea, constipation, melena or hematochezia. GU:  No urgency, frequency, dysuria, or hematuria. Musculoskeletal:  No back pain.  No joint pain.  No muscle tenderness. Extremities:  No pain or swelling. Skin:  No rashes or skin changes. Neuro:  No headache, numbness or weakness, balance or coordination issues. Endocrine:  No diabetes, thyroid issues, hot flashes or night sweats.  Psych:  No mood changes, depression or anxiety. Pain:  No focal pain. Review of systems:  All other systems reviewed and found to be negative.   Physical Exam: Blood pressure 141/65, pulse 73, temperature 98.2 F (36.8 C), temperature source Tympanic, height 5\' 3"  (1.6 m), weight 120 lb 2.4 oz (54.5 kg). GENERAL:  Well developed, well nourished woman sitting comfortably in the exam room in no acute distress. MENTAL STATUS:  Alert and oriented to person, place and time. HEAD:  Shoulder length hair.  Normocephalic, atraumatic, face symmetric, no Cushingoid features. EYES:  Blue eyes.  No  conjunctivitis or scleral icterus. PSYCH:  Appropriate.   No visits with results within 3 Day(s) from this visit. Latest known visit with results is:  Clinical Support on 03/09/2015  Component Date Value Ref Range Status  . Absolute CD 4 Helper 03/09/2015 1026  359 - 1519 /uL Final  . % CD 4 Pos. Lymph. 03/09/2015 38.0  30.8 - 58.5 % Final  . WBC 03/09/2015 7.7  3.4 - 10.8 x10E3/uL Final  . RBC 03/09/2015 4.12  3.77 - 5.28 x10E6/uL Final  . Hematocrit 03/09/2015 37.2  34.0 - 46.6 % Final  . MCV 03/09/2015 90  79 - 97 fL Final  . MCH 03/09/2015 30.3  26.6 - 33.0 pg Final  . MCHC 03/09/2015 33.6  31.5 - 35.7 g/dL Final  . RDW 03/09/2015 13.7  12.3 - 15.4 % Final  . Platelets 03/09/2015 339  150 - 379 x10E3/uL Final  . NEUTROPHILS 03/09/2015 53   Final  . Lymphs 03/09/2015 35   Final  . Monocytes 03/09/2015 7   Final  . Eos 03/09/2015 5   Final  . Basos 03/09/2015 0   Final  . Neutrophils Absolute 03/09/2015 4.1  1.4 - 7.0 x10E3/uL Final  . Lymphocytes Absolute 03/09/2015 2.7  0.7 - 3.1 x10E3/uL Final  . Monocytes Absolute 03/09/2015 0.5  0.1 - 0.9 x10E3/uL Final  . EOS (ABSOLUTE) 03/09/2015 0.4  0.0 - 0.4 x10E3/uL Final  . Basophils Absolute 03/09/2015 0.0  0.0 - 0.2 x10E3/uL Final  . Immature Granulocytes 03/09/2015 0   Final  . Immature Grans (Abs) 03/09/2015 0.0  0.0 - 0.1 x10E3/uL Final   Comment: (NOTE) Performed At: [facility] [place], [place] [id number] Lindon Romp MD [id number]   . Hemoglobin 03/09/2015 12.5  11.1 - 15.9 g/dL Final    Assessment:  Beverly Alvarez is a 54 y.o. female with probable reactive axillary adenopathy.  She may have periodic mild hidradenitis suppurativa.  She has had unusual flat wart outbreaks involving the left lower extremity.  HIV testing on 09/24/2014 was negative.  She has vasomotor symptoms. She has had a 10-12 pound intentional weight loss in the past 6 months.   CBC with diff on 02/10/2015 revealed a  hematocrit of 38.6, hemoglobin 12.5, MCV 93, platelets 344,000, and white count 8200 with an ANC of 4300.  Work-up on 03/04/2015 revealed a normal LDH, uric acid, complement, and ANA.  CD4 count was normal.  SPEP revealed no monoclonal protein.  Serum immunoglobulins were normal.  She feels well.  She is up to date on her mammogram.  She denies any breast concerns.  Exam reveals no adenopathy or hepatosplenomegaly.  Plan: 1. Review work-up. 2. Reassurance. 3. RTC prn.   Lequita Asal, MD  03/18/2015, 9:33 AM

## 2015-03-18 NOTE — Progress Notes (Signed)
Pt here today for follow up reagrding adenopathy; offers no complaints today

## 2015-10-21 ENCOUNTER — Other Ambulatory Visit: Payer: Self-pay | Admitting: Family Medicine

## 2015-10-21 ENCOUNTER — Ambulatory Visit
Admission: RE | Admit: 2015-10-21 | Discharge: 2015-10-21 | Disposition: A | Discharge status: Home / Self Care | Payer: BLUE CROSS/BLUE SHIELD | Source: Ambulatory Visit | Attending: Family Medicine | Admitting: Family Medicine

## 2015-10-21 DIAGNOSIS — Z1231 Encounter for screening mammogram for malignant neoplasm of breast: Secondary | ICD-10-CM | POA: Insufficient documentation

## 2015-11-17 ENCOUNTER — Encounter: Payer: BLUE CROSS/BLUE SHIELD | Attending: Family Medicine | Admitting: *Deleted

## 2015-11-17 ENCOUNTER — Encounter: Payer: Self-pay | Admitting: *Deleted

## 2015-11-17 VITALS — BP 142/78 | Ht 62.5 in | Wt 118.4 lb

## 2015-11-17 DIAGNOSIS — E109 Type 1 diabetes mellitus without complications: Secondary | ICD-10-CM | POA: Diagnosis present

## 2015-11-17 DIAGNOSIS — E139 Other specified diabetes mellitus without complications: Secondary | ICD-10-CM

## 2015-11-17 NOTE — Patient Instructions (Addendum)
Check blood sugars 1 x day before breakfast or 2 hrs after supper every day Exercise: Continue program for  60  minutes  6-7  days a week Eat 3 meals day,  2 snacks a day Space meals 4-6 hours apart Complete 3 Day Food Record and bring to next appt Bring blood sugar records to the next appointment

## 2015-11-18 NOTE — Progress Notes (Signed)
Diabetes Self-Management Education  Visit Type: First/Initial  Appt. Start Time: 1430 Appt. End Time: 1600  11/18/2015  [name], identified by name and date of birth, is a 55 y.o. female with a diagnosis of Diabetes: Type 1 (LADA).   ASSESSMENT  Blood pressure 142/78, height 5' 2.5" (1.588 m), weight 118 lb 6.4 oz (53.706 kg). Body mass index is 21.3 kg/(m^2).      Diabetes Self-Management Education - 11/17/15 1632    Visit Information   Visit Type First/Initial   Initial Visit   Diabetes Type Type 1  LADA   Are you currently following a meal plan? Yes   What type of meal plan do you follow? reducing fats, carbs and added sugars   Are you taking your medications as prescribed? Yes   Date Diagnosed Feb 2017   Health Coping   How would you rate your overall health? Good   Psychosocial Assessment   Patient Belief/Attitude about Diabetes Motivated to manage diabetes  "frustrated and sad"   Self-care barriers None   Self-management support Doctor's office;Family   Patient Concerns Nutrition/Meal planning;Medication;Glycemic Control;Healthy Lifestyle   Special Needs None   Preferred Learning Style Auditory;Hands on   Learning Readiness Change in progress   How often do you need to have someone help you when you read instructions, pamphlets, or other written materials from your doctor or pharmacy? 1 - Never   What is the last grade level you completed in school? Masters   Complications   Last HgB A1C per patient/outside source 7.3 %  10/27/15   How often do you check your blood sugar? 1-2 times/day   Fasting Blood glucose range (mg/dL) 70-129;130-179  FBG's 126-161 mg/dL   Postprandial Blood glucose range (mg/dL) 130-179  2 pp readings 148 and 143 mg/dL   Have you had a dilated eye exam in the past 12 months? Yes   Have you had a dental exam in the past 12 months? Yes   Are you checking your feet? Yes   How many days per week are you checking your feet? 1   Dietary Intake   Breakfast cereal, almond milk, 1/2 banana; english muffin with peanut butter; yogurt, fruit, oats, walnuts  pt is keeping food diary   Snack (morning) pretzels and almonds; crackers and cheese; applesauce and nuts   Lunch chicken, brown rice, fruit; [place], sweet potatotes, green beans, fruit   Snack (afternoon) same as morning   Dinner white bean [place] chili; [place] wraps; cereal and milk   Snack (evening) graham cracker and cool whip   Beverage(s) water, unsweetened drinks; diet juice   Exercise   Exercise Type Moderate (swimming / aerobic walking)   How many days per week to you exercise? 6   How many minutes per day do you exercise? 60   Total minutes per week of exercise 360   Patient Education   Previous Diabetes Education No   Disease state  Definition of diabetes, type 1 and 2, and the diagnosis of diabetes   Nutrition management  Role of diet in the treatment of diabetes and the relationship between the three main macronutrients and blood glucose level;Carbohydrate counting;Food label reading, portion sizes and measuring food.   Physical activity and exercise  Role of exercise on diabetes management, blood pressure control and cardiac health.   Monitoring Purpose and frequency of SMBG.;Identified appropriate SMBG and/or A1C goals.   Chronic complications Relationship between chronic complications and blood glucose control   Psychosocial adjustment Identified  and addressed patients feelings and concerns about diabetes   Individualized Goals (developed by patient)   Reducing Risk Improve blood sugars Avoid medications Prevent diabetes complications Lead a healthier lifestyle Become more fit   Outcomes   Expected Outcomes Demonstrated interest in learning. Expect positive outcomes      Individualized Plan for Diabetes Self-Management Training:   Learning Objective:  Patient will have a greater understanding of diabetes self-management. Patient education  plan is to attend individual and/or group sessions per assessed needs and concerns.   Plan:   Patient Instructions  Check blood sugars 1 x day before breakfast or 2 hrs after supper every day Exercise: Continue program for  60  minutes  6-7  days a week Eat 3 meals day,  2 snacks a day Space meals 4-6 hours apart Complete 3 Day Food Record and bring to next appt Bring blood sugar records to the next appointment   Expected Outcomes:  Demonstrated interest in learning. Expect positive outcomes  Education material provided:  General Meal Planning Guidelines Simple Meal Plan 3 Day Food Record  If problems or questions, patient to contact team via:   Beverly Drilling, RN, [place], CDE [phone]  Future DSME appointment:  Tuesday December 08, 2015 with Beverly Alvarez (dietitian)

## 2015-12-08 ENCOUNTER — Encounter: Payer: BLUE CROSS/BLUE SHIELD | Attending: Family Medicine | Admitting: Dietician

## 2015-12-08 ENCOUNTER — Encounter: Payer: Self-pay | Admitting: Dietician

## 2015-12-08 VITALS — Ht 62.5 in | Wt 113.8 lb

## 2015-12-08 DIAGNOSIS — E109 Type 1 diabetes mellitus without complications: Secondary | ICD-10-CM | POA: Diagnosis not present

## 2015-12-08 DIAGNOSIS — E139 Other specified diabetes mellitus without complications: Secondary | ICD-10-CM

## 2015-12-08 NOTE — Progress Notes (Signed)
Diabetes Self-Management Education  Visit Type:  Follow-up  Appt. Start Time: 1020 Appt. End Time: 1140  12/08/2015  Beverly Alvarez, identified by name and date of birth, is a 55 y.o. female with a diagnosis of Diabetes:  .   ASSESSMENT  Height 5' 2.5" (1.588 m), weight 113 lb 12.8 oz (51.619 kg). Body mass index is 20.47 kg/(m^2).       Diabetes Self-Management Education - [id number]    Complications   How often do you check your blood sugar? 1-2 times/day   Fasting Blood glucose range (mg/dL) 130-179   Postprandial Blood glucose range (mg/dL) 180-200;>200   Have you had a dilated eye exam in the past 12 months? Yes   Have you had a dental exam in the past 12 months? Yes   Are you checking your feet? Yes   How many days per week are you checking your feet? 3   Dietary Intake   Breakfast 3 meals and 2-3 snacks daily   Exercise   Exercise Type Moderate (swimming / aerobic walking)   How many days per week to you exercise? 6.5   How many minutes per day do you exercise? 90   Total minutes per week of exercise 585   Patient Education   Disease state  Definition of diabetes, type 1 and 2, and the diagnosis of diabetes   Nutrition management  Role of diet in the treatment of diabetes and the relationship between the three main macronutrients and blood glucose level;Carbohydrate counting;Other (comment)  calculated Carbohydrate needs at 40% or 1700kcal to prevent further weight loss while minimizing insulin needs.  Instructed patient on basic meal planning.    Physical activity and exercise  Role of exercise on diabetes management, blood pressure control and cardiac health.   Monitoring Taught/discussed recording of test results and interpretation of SMBG.      Learning Objective:  Patient will have a greater understanding of diabetes self-management. Patient education plan is to attend individual and/or group sessions per assessed needs and concerns.   Plan:   Patient  Instructions   Add carbohydrate to your meals to include 30-45g per meal, by increasing portions or adding an additional carb food.  Include protein foods with each meal, average palm-size portions.       Expected Outcomes:     Education material provided: Planning A Balanced Meal with guide for 1700kcal intake (40%CHO, 30% protein, 30% fat)         Carbohydrate Counting (Apidra)         Diabetes-Friendly menus and Recipes         Website and online diabetes resource list (from class 3 materials)  If problems or questions, patient to contact team via:  Phone  Future DSME appointment: -  12/29/15 with Johny Drilling, RN, CDE

## 2015-12-08 NOTE — Patient Instructions (Signed)
   Add carbohydrate to your meals to include 30-45g per meal, by increasing portions or adding an additional carb food.  Include protein foods with each meal, average palm-size portions.

## 2015-12-29 ENCOUNTER — Encounter: Payer: Self-pay | Admitting: *Deleted

## 2015-12-29 ENCOUNTER — Encounter: Payer: BLUE CROSS/BLUE SHIELD | Attending: Family Medicine | Admitting: *Deleted

## 2015-12-29 VITALS — Wt 109.2 lb

## 2015-12-29 DIAGNOSIS — E109 Type 1 diabetes mellitus without complications: Secondary | ICD-10-CM | POA: Diagnosis present

## 2015-12-29 DIAGNOSIS — E139 Other specified diabetes mellitus without complications: Secondary | ICD-10-CM

## 2015-12-29 NOTE — Progress Notes (Signed)
Diabetes Self-Management Education  Visit Type:  Follow-up  Appt. Start Time: [id number] Appt. End Time: 1410  12/29/2015  [name], identified by name and date of birth, is a 55 y.o. female with a diagnosis of Diabetes:  .   ASSESSMENT  Weight 109 lb 3.2 oz (49.533 kg). Body mass index is 19.64 kg/(m^2).       Diabetes Self-Management Education - [id number]    Complications   How often do you check your blood sugar? 1-2 times/day   Fasting Blood glucose range (mg/dL) 70-129;130-179  FBG's 121-170 mg/dL   Postprandial Blood glucose range (mg/dL) 130-179;180-200;>200  pp's 140-268 mg/dL   Have you had a dilated eye exam in the past 12 months? Yes   Have you had a dental exam in the past 12 months? Yes   Are you checking your feet? Yes   How many days per week are you checking your feet? 4   Dietary Intake   Breakfast 3 meals/2-3 snacks/day   Exercise   Exercise Type Moderate (swimming / aerobic walking)   How many days per week to you exercise? 6   How many minutes per day do you exercise? 60   Total minutes per week of exercise 360   Patient Education   Disease state  Definition of diabetes, type 1 and 2, and the diagnosis of diabetes   Nutrition management  Carbohydrate counting   Physical activity and exercise  Role of exercise on diabetes management, blood pressure control and cardiac health.   Medications Taught/reviewed insulin injection, site rotation, insulin storage and needle disposal.;Reviewed patients medication for diabetes, action, purpose, timing of dose and side effects.;Reviewed medication adjustment guidelines for hyperglycemia and sick days.   Monitoring Ketone testing, when, how.   Acute complications Taught treatment of hypoglycemia - the 15 rule.;Discussed and identified patients' treatment of hyperglycemia.   Psychosocial adjustment Travel strategies   Individualized Goals (developed by patient)   Nutrition Follow meal plan discussed   Physical  Activity Exercise 5-7 days per week  Avoid strenuous exercise if BG > 250 mg/dL   Medications take my medication as prescribed   Monitoring  test my blood glucose as discussed  check BG prior to lunch and supper   Reducing Risk treat hypoglycemia with 15 grams of carbs if blood glucose less than 70mg /dL;examine blood glucose patterns      Learning Objective:  Patient will have a greater understanding of diabetes self-management. Patient education plan is to attend individual and/or group sessions per assessed needs and concerns.   Plan:   Patient Instructions  Carry fast acting glucose and a snack at all times. Rotate injection sites Check blood sugars 3 x day before meals Continue exercise program for 60  minutes 6 days a week No strenuous exercise if blood sugars above 250 mg/dL Bring blood sugar records to the next appointment   Expected Outcomes:  Demonstrated interest in learning. Expect positive outcomes  Education material provided:  The Other Diabetes: LADA or Type 1.5 Glucose tablets Symptoms, causes and treatments of Hypoglycemia Site selection ID Card  Medic Alert/Medic ID Forms Ketone Testing DKA/Sick Day Log Book  If problems or questions, patient to contact team via:   Johny Drilling, [place], [place], CDE [phone]  Future DSME appointment:  Monday January 11, 2016 with Pam (dietitian)

## 2015-12-29 NOTE — Patient Instructions (Addendum)
Carry fast acting glucose and a snack at all times. Rotate injection sites Check blood sugars 3 x day before meals Continue exercise program for 60  minutes 6 days a week No strenuous exercise if blood sugars above 250 mg/dL Bring blood sugar records to the next appointment

## 2016-01-11 ENCOUNTER — Encounter: Payer: Self-pay | Admitting: Dietician

## 2016-01-11 ENCOUNTER — Encounter: Payer: BLUE CROSS/BLUE SHIELD | Admitting: Dietician

## 2016-01-11 VITALS — Ht 62.5 in | Wt 108.0 lb

## 2016-01-11 DIAGNOSIS — E139 Other specified diabetes mellitus without complications: Secondary | ICD-10-CM

## 2016-01-11 DIAGNOSIS — E109 Type 1 diabetes mellitus without complications: Secondary | ICD-10-CM | POA: Diagnosis not present

## 2016-01-11 NOTE — Progress Notes (Signed)
Diabetes Self-Management Education  Visit Type:  Follow-up  Appt. Start Time: 1315 Appt. End Time: [id number]  01/11/2016  Beverly Alvarez, identified by name and date of birth, is a 55 y.o. female with a diagnosis of Diabetes:  .   ASSESSMENT  Height 5' 2.5" (1.588 m), weight 108 lb (48.988 kg). Body mass index is 19.43 kg/(m^2).       Diabetes Self-Management Education - [id number]    Complications   How often do you check your blood sugar? 3-4 times/day   Fasting Blood glucose range (mg/dL) 70-129;130-179   Postprandial Blood glucose range (mg/dL) 130-179;180-200;>200   Have you had a dilated eye exam in the past 12 months? Yes   Have you had a dental exam in the past 12 months? Yes   Are you checking your feet? Yes   How many days per week are you checking your feet? 4   Dietary Intake   Breakfast 3 meals and 2-3 snacks daily   Snack (morning) counting carbohydrate grams for Novolog dose, using 1:15 ratio.   Exercise   Exercise Type Moderate (swimming / aerobic walking)   How many days per week to you exercise? 6   How many minutes per day do you exercise? 60   Total minutes per week of exercise 360   Patient Education   Nutrition management  Carbohydrate counting;Other (comment)  review of carbohydrate needs; preventing further weight loss by including adequate carbohydrate and protein   Physical activity and exercise  Other (comment)  effects of exercise on post-meal BGs in relation to insulin doses      Learning Objective:  Patient will have a greater understanding of diabetes self-management. Patient education plan is to attend individual and/or group sessions per assessed needs and concerns.   Plan:   There are no Patient Instructions on file for this visit.   Expected Outcomes:  Demonstrated interest in learning. Expect positive outcomes  Education material provided:   If problems or questions, patient to contact team via:  Phone  Future DSME  appointment: -  to be determined

## 2016-01-11 NOTE — Patient Instructions (Signed)
   Continue to increase carb intake to goal of 30-45grams with each meal.

## 2016-01-27 ENCOUNTER — Telehealth: Payer: Self-pay | Admitting: Dietician

## 2016-01-27 NOTE — Telephone Encounter (Signed)
Called patient to check on progress and schedule appt with RN if needed.  Left voicemail messages at her home and mobile numbers for her to call back.

## 2016-03-07 ENCOUNTER — Encounter: Payer: Self-pay | Admitting: *Deleted

## 2016-10-07 ENCOUNTER — Other Ambulatory Visit: Payer: Self-pay | Admitting: Family Medicine

## 2016-10-07 DIAGNOSIS — Z1231 Encounter for screening mammogram for malignant neoplasm of breast: Secondary | ICD-10-CM

## 2016-10-31 ENCOUNTER — Ambulatory Visit
Admission: RE | Admit: 2016-10-31 | Discharge: 2016-10-31 | Disposition: A | Discharge status: Home / Self Care | Payer: BLUE CROSS/BLUE SHIELD | Source: Ambulatory Visit | Attending: Family Medicine | Admitting: Family Medicine

## 2016-10-31 DIAGNOSIS — Z1231 Encounter for screening mammogram for malignant neoplasm of breast: Secondary | ICD-10-CM | POA: Diagnosis not present

## 2017-01-11 ENCOUNTER — Ambulatory Visit
Admission: RE | Admit: 2017-01-11 | Discharge: 2017-01-11 | Disposition: A | Discharge status: Home / Self Care | Payer: BLUE CROSS/BLUE SHIELD | Source: Ambulatory Visit | Attending: Chiropractor | Admitting: Chiropractor

## 2017-01-12 ENCOUNTER — Other Ambulatory Visit: Payer: Self-pay | Admitting: Chiropractor

## 2017-01-12 ENCOUNTER — Ambulatory Visit
Admission: RE | Admit: 2017-01-12 | Discharge: 2017-01-12 | Disposition: A | Discharge status: Home / Self Care | Payer: BLUE CROSS/BLUE SHIELD | Source: Ambulatory Visit | Attending: Chiropractor | Admitting: Chiropractor

## 2017-01-12 DIAGNOSIS — M545 Low back pain: Principal | ICD-10-CM

## 2017-01-12 DIAGNOSIS — M160 Bilateral primary osteoarthritis of hip: Secondary | ICD-10-CM | POA: Insufficient documentation

## 2017-01-12 DIAGNOSIS — G8929 Other chronic pain: Secondary | ICD-10-CM

## 2017-01-12 DIAGNOSIS — M4186 Other forms of scoliosis, lumbar region: Secondary | ICD-10-CM | POA: Insufficient documentation

## 2017-01-12 DIAGNOSIS — M533 Sacrococcygeal disorders, not elsewhere classified: Secondary | ICD-10-CM | POA: Diagnosis present

## 2017-10-02 ENCOUNTER — Other Ambulatory Visit: Payer: Self-pay | Admitting: Family Medicine

## 2017-10-02 DIAGNOSIS — Z1231 Encounter for screening mammogram for malignant neoplasm of breast: Secondary | ICD-10-CM

## 2017-10-17 ENCOUNTER — Encounter: Payer: Self-pay | Admitting: Dietician

## 2017-10-17 ENCOUNTER — Encounter: Payer: BLUE CROSS/BLUE SHIELD | Attending: Internal Medicine | Admitting: Dietician

## 2017-10-17 DIAGNOSIS — E109 Type 1 diabetes mellitus without complications: Secondary | ICD-10-CM | POA: Diagnosis not present

## 2017-10-17 NOTE — Patient Instructions (Addendum)
   Be careful exercising with in 2 hours of taking Novolog and after supper  Lower meal dose of Novolog if plans to exercise after meals-try half dose and adjust as needed Eat some carbs if bedtime BS less than 120 and adjust amount according to  BG and add some protein  Limit intake of sweets/desserts Keep carbohydrate grams no more than 45 -50 grams /meal Take Novolog 15-20 min before meals if able  Carry fast acting glucose and a snack at all times Rotate injection sites Check on [employer] and [employer] by Amedeo Gory Call if decides to schedule another appointment

## 2017-10-17 NOTE — Progress Notes (Signed)
Diabetes Self-Management Education  Visit Type: First/Initial  Appt. Start Time: 1000 Appt. End Time: 1200  10/17/2017  [name], identified by name and date of birth, is a 57 y.o. female with a diagnosis of Diabetes: Type 1.   ASSESSMENT  Blood pressure 126/86, height 5\' 2"  (1.575 m), weight 113 lb 14.4 oz (51.7 kg). Body mass index is 20.83 kg/m.  Diabetes Self-Management Education - 10/17/17 1620      Visit Information   Visit Type  First/Initial      Initial Visit   Diabetes Type  Type 1      Health Coping   How would you rate your overall health?  Good      Psychosocial Assessment   Patient Belief/Attitude about Diabetes  Motivated to manage diabetes    Self-care barriers  None    Self-management support  Doctor's office;Family    Other persons present  Patient    Patient Concerns  Glycemic Control;Healthy Lifestyle prevent complications, become more fit, manage insulin and exercise better, better sleep and more BG time in range    Special Needs  None    Preferred Learning Style  Hands on;Auditory    Learning Readiness  Ready    What is the last grade level you completed in school?  MS      Pre-Education Assessment   Patient understands the diabetes disease and treatment process.  Demonstrates understanding / competency    Patient understands incorporating nutritional management into lifestyle.  Demonstrates understanding / competency    Patient undertands incorporating physical activity into lifestyle.  Demonstrates understanding / competency    Patient understands using medications safely.  Demonstrates understanding / competency    Patient understands monitoring blood glucose, interpreting and using results  Demonstrates understanding / competency    Patient understands prevention, detection, and treatment of acute complications.  Demonstrates understanding / competency    Patient understands prevention, detection, and treatment of chronic complications.   Demonstrates understanding / competency    Patient understands how to develop strategies to address psychosocial issues.  Demonstrates understanding / competency    Patient understands how to develop strategies to promote health/change behavior.  Demonstrates understanding / competency      Complications   Last HgB A1C per patient/outside source  8.1 % 08-08-17    How often do you check your blood sugar?  > 4 times/day uses Libre ; occasionally has low BG's during night and at times with exercise    Fasting Blood glucose range (mg/dL)  130-179;180-200;>200;70-129    Postprandial Blood glucose range (mg/dL)  70-129;180-200;>200;130-179    Have you had a dilated eye exam in the past 12 months?  Yes 09-2017; goes every 6 montsh    Have you had a dental exam in the past 12 months?  Yes 05-2017; goes every 6 months    Are you checking your feet?  Yes    How many days per week are you checking your feet?  5      Dietary Intake   Breakfast  breakfast time varies 6:30-8p    Snack (morning)  eats small amount nuts some    Lunch  eats at 12p-1p; eats small amount sweets after most meals (carbo content of meals vary 40-105 grams carbs per meal    Snack (afternoon)  occasionally eats small amount of nuts ; eats snack if low BG    Dinner  eats at 6p-7p    Snack (evening)  eats snack if BG <120  Beverage(s)  drinks 6-7 glasses of water/day, 1 glass milk/day, 4-5 unsweetened drinks/day      Exercise   Exercise Type  Light (walking / raking leaves);Moderate (swimming / aerobic walking) walks, plays tennis, zumba, biking    How many days per week to you exercise?  3    How many minutes per day do you exercise?  75    Total minutes per week of exercise  225      Patient Education   Previous Diabetes Education  Yes (please comment)    Nutrition management   Carbohydrate counting;Meal timing in regards to the patients' current diabetes medication. recommend to eat 45-50 carb grams/meal and 15 grams  carbs/snack    Physical activity and exercise   Role of exercise on diabetes management, blood pressure control and cardiac health.;Identified with patient nutritional and/or medication changes necessary with exercise. reports low BG's at times with exercise    Medications  Taught/reviewed insulin injection, site rotation, insulin storage and needle disposal.;Reviewed patients medication for diabetes, action, purpose, timing of dose and side effects. discussed advantages and disadvantages of insulin pump therapy-gave info packet on insulin pumps    Monitoring  -- MD recommends A1C goal 7.5 due to hypoglycemia; uses Libre sensor    Acute complications  Taught treatment of hypoglycemia - the 15 rule.;Trained/discussed glucagon administration to patient and designated other.    Personal strategies to promote health  Lifestyle issues that need to be addressed for better diabetes care;Helped patient develop diabetes management plan for (enter comment) improve diet      Outcomes   Expected Outcomes  Demonstrated interest in learning. Expect positive outcomes       Individualized Plan for Diabetes Self-Management Training:   Learning Objective:  Patient will have a greater understanding of diabetes self-management. Patient education plan is to attend individual and/or group sessions per assessed needs and concerns.   Plan:   Patient Instructions    Be careful exercising with in 2 hours of taking Novolog and after supper  Lower meal dose of Novolog if plans to exercise soon after meals-try half dose and adjust as needed Eat some carbs 15-20 grams if bedtime BG less than 120-adjust amount according to BG and add some protein  Limit intake of sweets/desserts Keep carbohydrate grams no more than 45 -50 grams /meal Take Novolog 15-20 min before meals if able  Carry fast acting glucose and a snack at all times Rotate injection sites Check on Beverly Alvarez by Amedeo Gory Call if decides  to schedule another appointment    Expected Outcomes:  Demonstrated interest in learning. Expect positive outcomes  Education material provided: info packets on insulin pumps  If problems or questions, patient to contact team via:  [phone]  Future DSME appointment:   call if desires to schedule another appointment

## 2017-11-01 ENCOUNTER — Ambulatory Visit
Admission: RE | Admit: 2017-11-01 | Discharge: 2017-11-01 | Disposition: A | Discharge status: Home / Self Care | Payer: BLUE CROSS/BLUE SHIELD | Source: Ambulatory Visit | Attending: Family Medicine | Admitting: Family Medicine

## 2017-11-01 DIAGNOSIS — Z1231 Encounter for screening mammogram for malignant neoplasm of breast: Secondary | ICD-10-CM | POA: Diagnosis not present

## 2017-11-21 ENCOUNTER — Encounter: Payer: Self-pay | Admitting: Dietician

## 2018-04-23 ENCOUNTER — Other Ambulatory Visit: Payer: Self-pay | Admitting: Physician Assistant

## 2018-04-23 DIAGNOSIS — R591 Generalized enlarged lymph nodes: Secondary | ICD-10-CM

## 2018-04-27 ENCOUNTER — Other Ambulatory Visit
Admission: RE | Admit: 2018-04-27 | Discharge: 2018-04-27 | Disposition: A | Discharge status: Home / Self Care | Payer: BLUE CROSS/BLUE SHIELD | Source: Ambulatory Visit | Attending: Physician Assistant | Admitting: Physician Assistant

## 2018-04-27 ENCOUNTER — Ambulatory Visit
Admission: RE | Admit: 2018-04-27 | Discharge: 2018-04-27 | Disposition: A | Discharge status: Home / Self Care | Payer: BLUE CROSS/BLUE SHIELD | Source: Ambulatory Visit | Attending: Physician Assistant | Admitting: Physician Assistant

## 2018-04-27 DIAGNOSIS — R599 Enlarged lymph nodes, unspecified: Secondary | ICD-10-CM | POA: Diagnosis not present

## 2018-04-27 DIAGNOSIS — R591 Generalized enlarged lymph nodes: Secondary | ICD-10-CM

## 2018-04-27 LAB — CREATININE, SERUM
CREATININE: 0.68 mg/dL (ref 0.44–1.00)
GFR calc Af Amer: >60 mL/min (ref >60)
GFR calc non Af Amer: >60 mL/min (ref >60)

## 2018-04-27 MED ORDER — IOPAMIDOL (ISOVUE-300) INJECTION 61%
75.0000 mL | Freq: Once | INTRAVENOUS | Status: AC | PRN
  Administered 2018-04-27: 75 mL via INTRAVENOUS

## 2018-09-25 ENCOUNTER — Other Ambulatory Visit: Payer: Self-pay | Admitting: Family Medicine

## 2018-09-25 DIAGNOSIS — Z1231 Encounter for screening mammogram for malignant neoplasm of breast: Secondary | ICD-10-CM

## 2018-11-27 ENCOUNTER — Ambulatory Visit
Admission: RE | Admit: 2018-11-27 | Discharge: 2018-11-27 | Disposition: A | Discharge status: Home / Self Care | Payer: BLUE CROSS/BLUE SHIELD | Source: Ambulatory Visit | Attending: Family Medicine | Admitting: Family Medicine

## 2018-11-27 ENCOUNTER — Other Ambulatory Visit: Payer: Self-pay

## 2018-11-27 DIAGNOSIS — Z1231 Encounter for screening mammogram for malignant neoplasm of breast: Secondary | ICD-10-CM | POA: Insufficient documentation

## 2019-10-15 ENCOUNTER — Other Ambulatory Visit: Payer: Self-pay | Admitting: Family Medicine

## 2019-10-15 DIAGNOSIS — Z1231 Encounter for screening mammogram for malignant neoplasm of breast: Secondary | ICD-10-CM

## 2020-02-03 ENCOUNTER — Ambulatory Visit
Admission: RE | Admit: 2020-02-03 | Discharge: 2020-02-03 | Disposition: A | Discharge status: Home / Self Care | Payer: BC Managed Care – PPO | Source: Ambulatory Visit | Attending: Family Medicine | Admitting: Family Medicine

## 2020-02-03 ENCOUNTER — Other Ambulatory Visit: Payer: Self-pay

## 2020-02-03 DIAGNOSIS — Z1231 Encounter for screening mammogram for malignant neoplasm of breast: Secondary | ICD-10-CM | POA: Diagnosis not present

## 2020-12-21 ENCOUNTER — Other Ambulatory Visit: Payer: Self-pay | Admitting: Family Medicine

## 2020-12-21 DIAGNOSIS — Z1231 Encounter for screening mammogram for malignant neoplasm of breast: Secondary | ICD-10-CM

## 2021-02-08 ENCOUNTER — Ambulatory Visit
Admission: RE | Admit: 2021-02-08 | Discharge: 2021-02-08 | Disposition: A | Discharge status: Home / Self Care | Payer: BC Managed Care – PPO | Source: Ambulatory Visit | Attending: Family Medicine | Admitting: Family Medicine

## 2021-02-08 ENCOUNTER — Other Ambulatory Visit: Payer: Self-pay

## 2021-02-08 DIAGNOSIS — Z1231 Encounter for screening mammogram for malignant neoplasm of breast: Secondary | ICD-10-CM | POA: Insufficient documentation

## 2021-05-04 ENCOUNTER — Ambulatory Visit
Admission: EM | Admit: 2021-05-04 | Discharge: 2021-05-04 | Disposition: A | Discharge status: Home / Self Care | Payer: BC Managed Care – PPO | Attending: Family Medicine | Admitting: Family Medicine

## 2021-05-04 ENCOUNTER — Encounter: Payer: Self-pay | Admitting: Emergency Medicine

## 2021-05-04 ENCOUNTER — Other Ambulatory Visit: Payer: Self-pay

## 2021-05-04 DIAGNOSIS — M542 Cervicalgia: Secondary | ICD-10-CM | POA: Diagnosis not present

## 2021-05-04 MED ORDER — BACLOFEN 10 MG PO TABS: 5.0000 mg | ORAL_TABLET | Freq: Three times a day (TID) | ORAL | 0 refills | Status: AC | PRN

## 2021-05-04 NOTE — Discharge Instructions (Addendum)
Heat.  Continue your meloxicam.  Medication as directed.  Gentle range of motion.  Take care  Dr. Lacinda Axon

## 2021-05-04 NOTE — ED Triage Notes (Signed)
Pt presents today with c/o of neck pain that began approx 3 days (denies injury). She is also concerned about a red area to right ankle that she noticed 3 days ago.

## 2021-05-04 NOTE — ED Provider Notes (Signed)
[facility]    CSN: [id number] Arrival date & time: 05/04/21  1322      History   Chief Complaint Chief Complaint  Patient presents with   Neck Pain   HPI 60 year old female presents with the above complaint.  Patient reports that she has had posterior neck pain for the past 3 days.  No known injury.  It is located to the right aspect of the occiput.  She reports pain with certain ranges of motion.  She has had no relief with heat and ice.  Pain 6/10 in severity.  No other associated symptoms.  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      Home Medications    Prior to Admission medications   Medication Sig Start Date End Date Taking? Authorizing Provider  baclofen (LIORESAL) 10 MG tablet Take 0.5-1 tablets (5-10 mg total) by mouth 3 (three) times daily as needed for muscle spasms. 05/04/21  Yes Jasmine Maceachern G, DO  Ascorbic Acid (VITAMIN C) 1000 MG tablet Take 1 tablet by mouth daily.    [provider]  aspirin EC  81 MG tablet Take 81 mg by mouth daily.     [provider]  glucagon, human recombinant, (GLUCAGEN DIAGNOSTIC) 1 MG injection Inject 1 mg into the muscle as needed. 08/08/17   [provider]  insulin aspart (NOVOLOG) 100 UNIT/ML FlexPen Inject 5-7 Units into the skin 3 (three) times daily before meals. + 1 unit/50 if BG>150 01/05/16   [provider]  insulin degludec (TRESIBA FLEXTOUCH) 100 UNIT/ML SOPN FlexTouch Pen Inject 13 Units into the skin daily. 10/27/16   [provider]  Multiple Vitamins-Minerals (MULTIVITAMIN ADULT PO) Take 1 tablet by mouth daily.    [provider]  Omega-3 Fatty Acids (FISH OIL) 1000 MG CAPS Take 1 capsule by mouth daily.    [provider]  OTEZLA 30 MG TABS Take 30 mg by mouth 2 (two) times daily.  02/23/15   [provider]  oxiconazole (OXISTAT) 1 % lotion Apply 1 application topically as needed.     [provider]  rosuvastatin (CRESTOR) 5 MG tablet Take 1 tablet by mouth daily. 12/01/16 12/01/17  [provider]  triamterene-hydrochlorothiazide (MAXZIDE-25) 37.5-25 MG per tablet Take 1 tablet by mouth daily.  02/09/15   [provider]    Family History Family History  Problem Relation Age of Onset  Cancer Father        Brain Cancer ( possible lung or pancreatic origin)   Cancer Sister 40       Breast Cancer   Breast cancer Sister 10   Diabetes Sister    Diabetes Mother    Diabetes Maternal Grandmother     Social History Social History   Tobacco Use   Smoking status: Never   Smokeless tobacco: Never  Substance Use Topics   Alcohol use: No     Allergies   Morphine and related   Review of Systems Review of Systems Per HPI  Physical Exam Triage Vital Signs ED Triage Vitals  Enc Vitals Group     BP 05/04/21 1340 (!) 161/80     Pulse Rate 05/04/21 1340 85     Resp 05/04/21 1340 16     Temp 05/04/21 1340 98.3 F (36.8 C)     Temp Source 05/04/21  1340 Oral     SpO2 05/04/21 1340 100 %     Weight --      Height --      Head Circumference --      Peak Flow --      Pain Score 05/04/21 1337 6     Pain Loc --      Pain Edu? --      Excl. in [place]? --    Updated Vital Signs BP (!) 161/80 (BP Location: Right Arm)   Pulse 85   Temp 98.3 F (36.8 C) (Oral)   Resp 16   SpO2 100%   Visual Acuity Right Eye Distance:   Left Eye Distance:   Bilateral Distance:    Right Eye Near:   Left Eye Near:    Bilateral Near:     Physical Exam Constitutional:      General: She is not in acute distress.    Appearance: Normal appearance. She is not ill-appearing.  HENT:     Head: Normocephalic and atraumatic.  Eyes:     General:        Right eye: No discharge.        Left eye: No discharge.     Conjunctiva/sclera: Conjunctivae normal.  Neck:      Comments: Patient with slight tenderness at the labeled location.  No significant erythema. Pulmonary:     Effort: Pulmonary effort is normal. No respiratory distress.  Neurological:     Mental Status: She is alert.  Psychiatric:        Mood and Affect: Mood normal.        Behavior: Behavior normal.     UC Treatments / Results  Labs (all labs ordered are listed, but only abnormal results are displayed) Labs Reviewed - No data to display  EKG   Radiology No results found.  Procedures Procedures (including critical care time)  Medications Ordered in UC Medications - No data to display  Initial Impression / Assessment and Plan / UC Course  I have reviewed the triage vital signs and the nursing notes.  Pertinent labs & imaging results that were available during my care of the patient were reviewed by me and considered in my medical decision making (see chart for details).    60 year old female presents with posterior neck pain.  This appears to be muscular in origin.  I suspect spasm versus trigger point.  She localizes the pain to area of muscle attachment.  Baclofen as  directed.  Advised heat and continued use of home meloxicam.  Gentle range  of motion.  Supportive care.  Final Clinical Impressions(s) / UC Diagnoses   Final diagnoses:  Neck pain     Discharge Instructions      Heat.  Continue your meloxicam.  Medication as directed.  Gentle range of motion.  Take care  Dr. Lacinda Axon    ED Prescriptions     Medication Sig Dispense Auth. Provider   baclofen (LIORESAL) 10 MG tablet Take 0.5-1 tablets (5-10 mg total) by mouth 3 (three) times daily as needed for muscle spasms. 82 each Coral Spikes, DO      PDMP not reviewed this encounter.   Coral Spikes, [place] 05/04/21 1645

## 2021-11-19 ENCOUNTER — Other Ambulatory Visit: Payer: Self-pay | Admitting: Family Medicine

## 2021-11-19 DIAGNOSIS — Z1231 Encounter for screening mammogram for malignant neoplasm of breast: Secondary | ICD-10-CM

## 2022-02-15 ENCOUNTER — Ambulatory Visit
Admission: RE | Admit: 2022-02-15 | Discharge: 2022-02-15 | Disposition: A | Discharge status: Home / Self Care | Payer: BC Managed Care – PPO | Source: Ambulatory Visit | Attending: Family Medicine | Admitting: Family Medicine

## 2022-02-15 DIAGNOSIS — Z1231 Encounter for screening mammogram for malignant neoplasm of breast: Secondary | ICD-10-CM | POA: Insufficient documentation

## 2022-09-24 IMAGING — MG MM DIGITAL SCREENING BILAT W/ TOMO AND CAD
8 series · 9 of 24 positions shown · non-contrast
Comparison: Previous exam(s).

CLINICAL DATA: Screening.

EXAM:
DIGITAL SCREENING BILATERAL MAMMOGRAM WITH TOMOSYNTHESIS AND CAD
TECHNIQUE: Bilateral screening digital craniocaudal and mediolateral oblique
mammograms were obtained. Bilateral screening digital breast
tomosynthesis was performed. The images were evaluated with
computer-aided detection.

[L CC synth-2D]
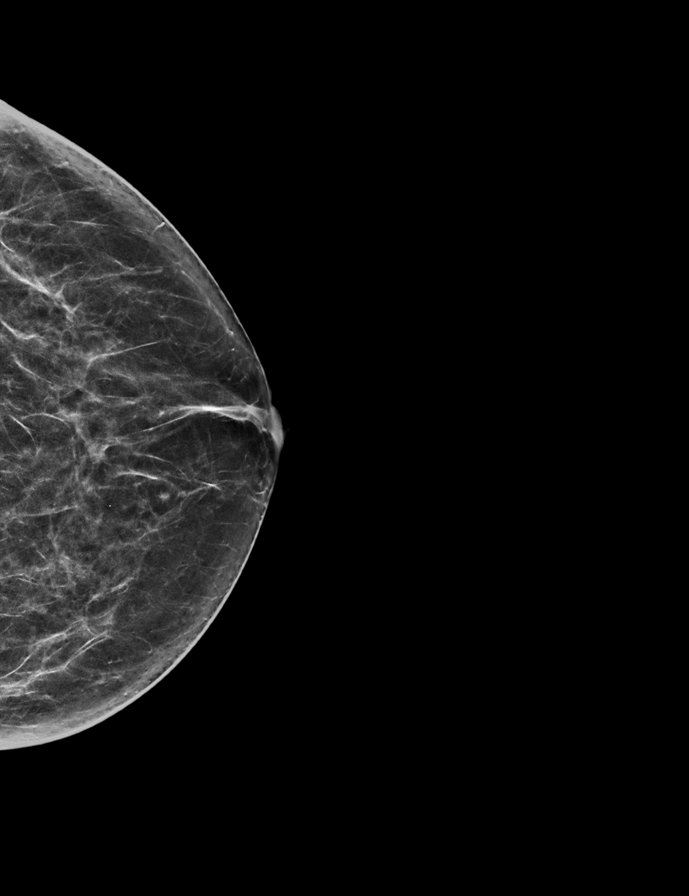

[R MLO synth-2D]
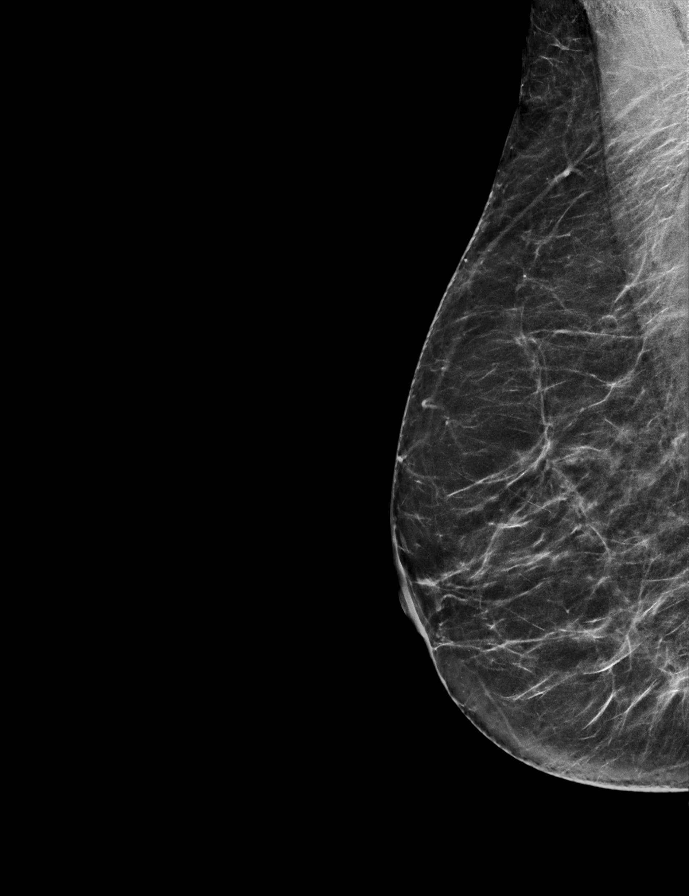

[R CC synth-2D]
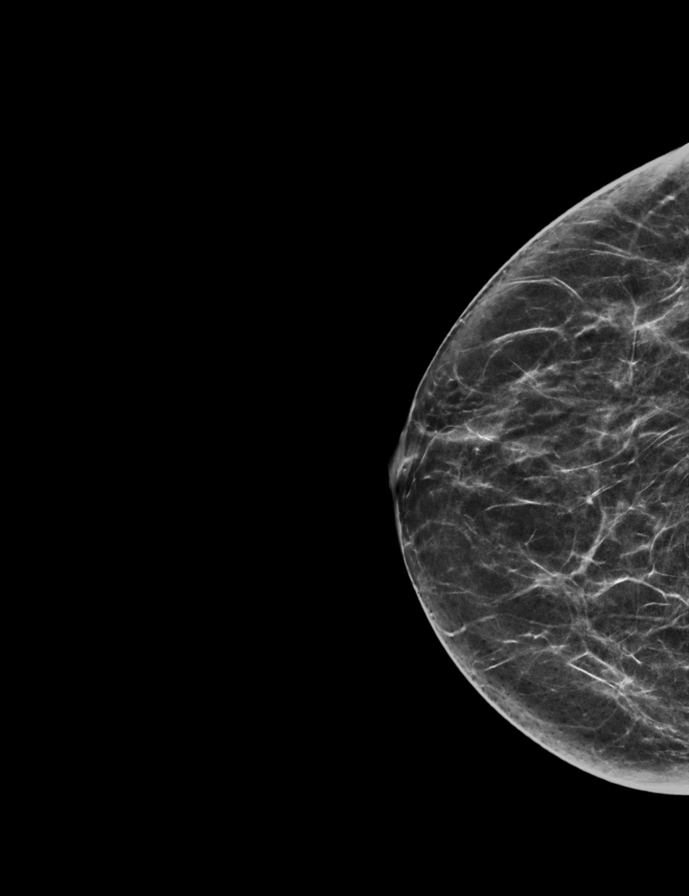

[L MLO synth-2D]
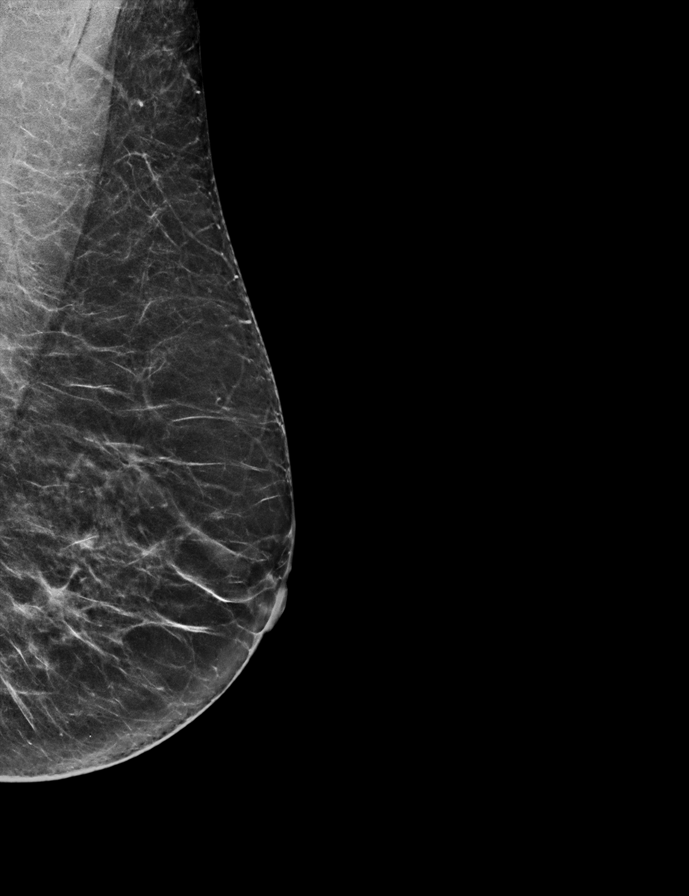

[L CC tomo · 2 of 59 frames shown]
[frame 20/59]
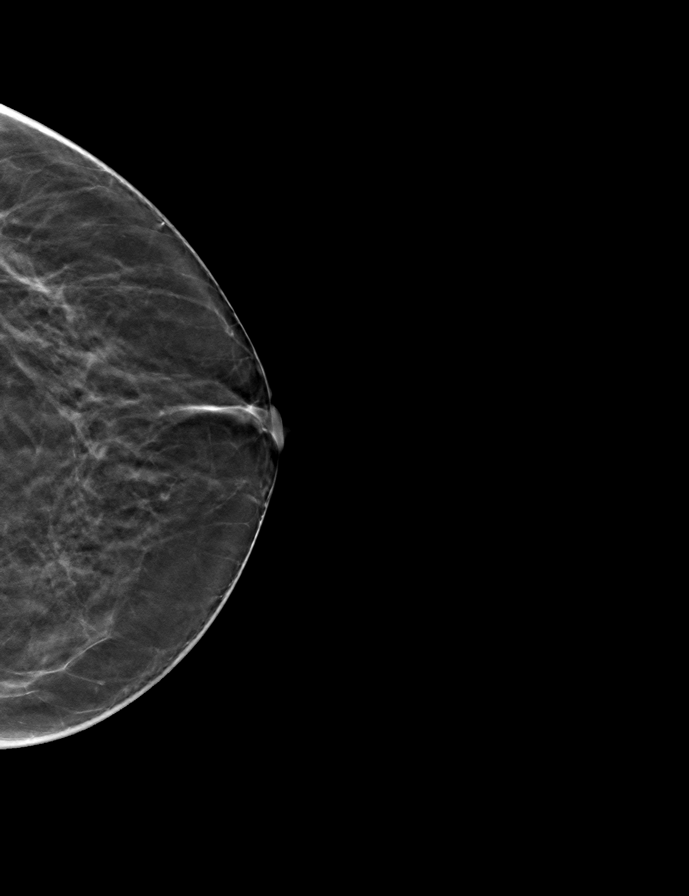
[frame 30/59]
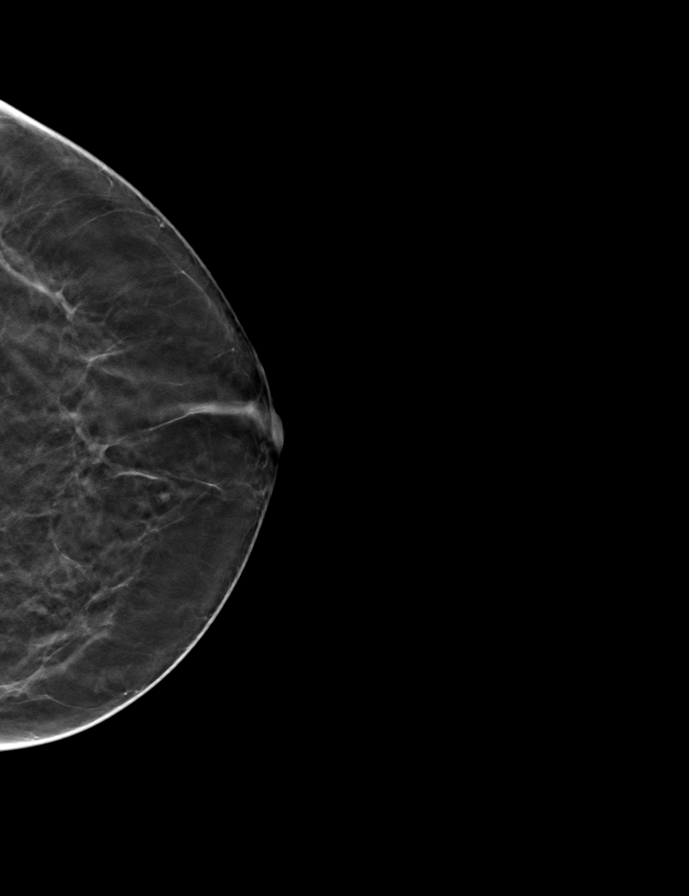

[R CC tomo · tomo slice 29/58.0]
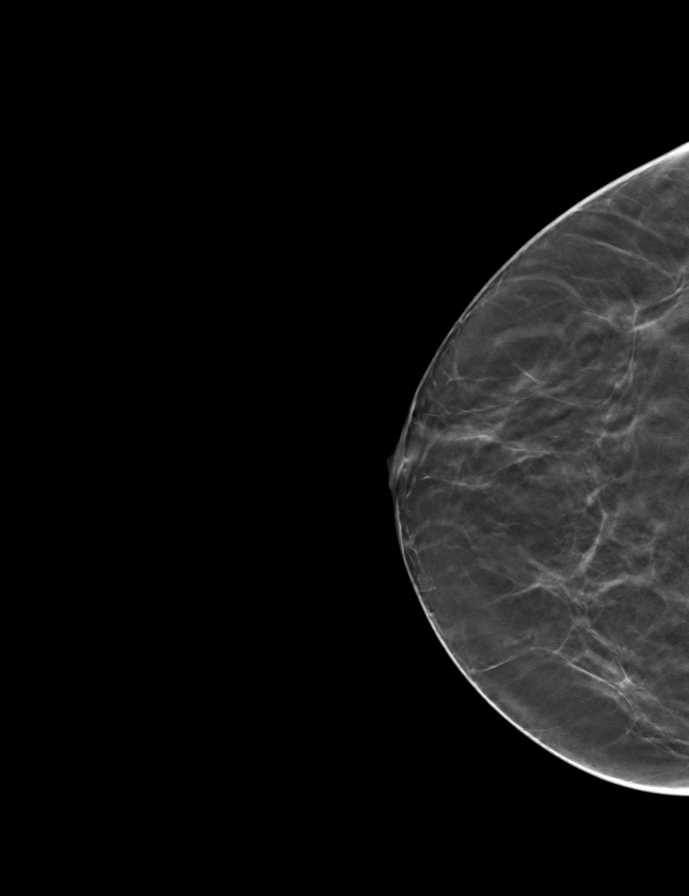

[L MLO tomo · tomo slice 29/57.0]
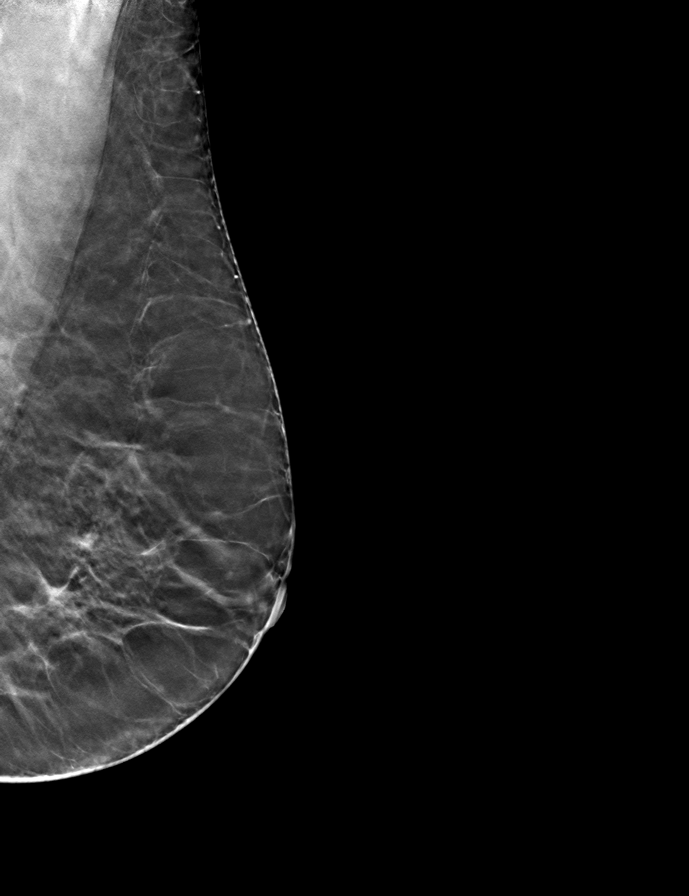

[R MLO tomo · tomo slice 30/59.0]
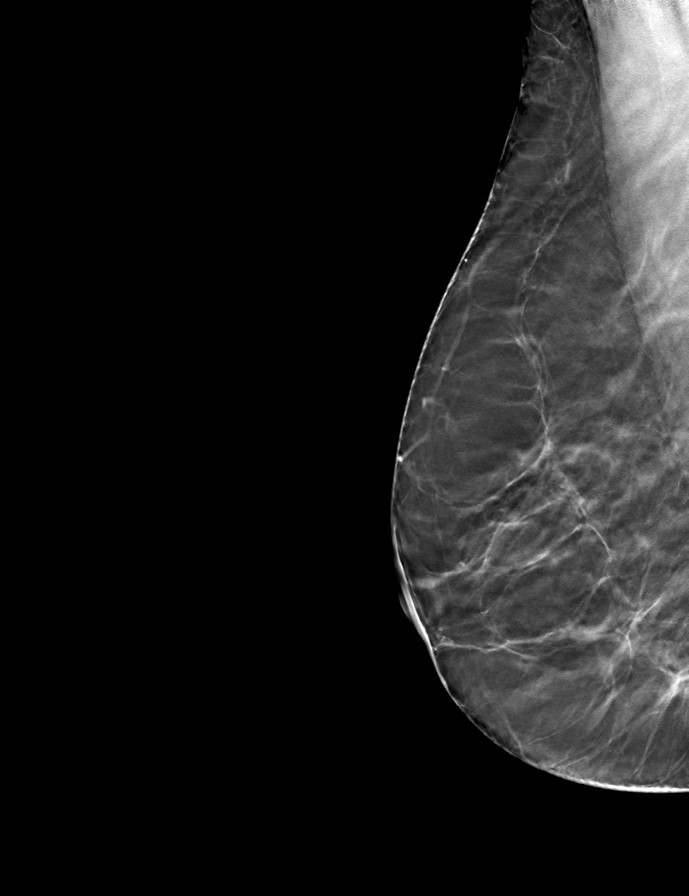

[9 of 24 positions shown; findings below may reference images not displayed]

ACR Breast Density Category b: There are scattered areas of
fibroglandular density.
FINDINGS: There are no findings suspicious for malignancy.
IMPRESSION: No mammographic evidence of malignancy. A result letter of this
screening mammogram will be mailed directly to the patient.

RECOMMENDATION:
Screening mammogram in one year. (Code:51-O-LD2)

BI-RADS CATEGORY  1: Negative.

## 2022-11-15 ENCOUNTER — Other Ambulatory Visit: Payer: Self-pay | Admitting: Family Medicine

## 2022-11-15 DIAGNOSIS — Z1231 Encounter for screening mammogram for malignant neoplasm of breast: Secondary | ICD-10-CM

## 2023-02-20 ENCOUNTER — Ambulatory Visit
Admission: RE | Admit: 2023-02-20 | Discharge: 2023-02-20 | Disposition: A | Discharge status: Home / Self Care | Payer: BC Managed Care – PPO | Source: Ambulatory Visit | Attending: Family Medicine | Admitting: Family Medicine

## 2023-02-20 DIAGNOSIS — Z1231 Encounter for screening mammogram for malignant neoplasm of breast: Secondary | ICD-10-CM | POA: Diagnosis present

## 2023-10-10 ENCOUNTER — Other Ambulatory Visit: Payer: Self-pay | Admitting: Family Medicine

## 2023-10-10 DIAGNOSIS — Z1231 Encounter for screening mammogram for malignant neoplasm of breast: Secondary | ICD-10-CM

## 2024-02-15 ENCOUNTER — Ambulatory Visit
Admission: RE | Admit: 2024-02-15 | Discharge: 2024-02-15 | Disposition: A | Discharge status: Home / Self Care | Payer: Self-pay | Source: Ambulatory Visit

## 2024-02-15 VITALS — BP 182/93 | HR 74 | Temp 98.2°F | Resp 16 | Ht 62.0 in | Wt 125.0 lb

## 2024-02-15 DIAGNOSIS — N3000 Acute cystitis without hematuria: Secondary | ICD-10-CM

## 2024-02-15 LAB — URINALYSIS, W/ REFLEX TO CULTURE (INFECTION SUSPECTED)
Bilirubin Urine: NEGATIVE
Glucose, UA: NEGATIVE mg/dL
Hgb urine dipstick: NEGATIVE
Ketones, ur: NEGATIVE mg/dL
Nitrite: NEGATIVE
Protein, ur: NEGATIVE mg/dL
RBC / HPF: NONE SEEN RBC/hpf (ref 0–5)
Specific Gravity, Urine: 1.015 (ref 1.005–1.030)
pH: 7 (ref 5.0–8.0)

## 2024-02-15 MED ORDER — PHENAZOPYRIDINE HCL 200 MG PO TABS: 200.0000 mg | ORAL_TABLET | Freq: Three times a day (TID) | ORAL | 0 refills | Status: AC

## 2024-02-15 MED ORDER — NITROFURANTOIN MONOHYD MACRO 100 MG PO CAPS: 100.0000 mg | ORAL_CAPSULE | Freq: Two times a day (BID) | ORAL | 0 refills | Status: AC

## 2024-02-15 NOTE — Discharge Instructions (Addendum)
  1. Acute cystitis without hematuria (Primary) - Urinalysis completed in UC shows few bacteria and small bacteria, no blood, no nitrite.  These findings are possibly indicative of urinary tract infection - Urine Culture completed in UC and sent to lab, results should be available in 2 to 3 days. - phenazopyridine (PYRIDIUM) 200 MG tablet; Take 1 tablet (200 mg total) by mouth 3 (three) times daily.  Dispense: 6 tablet; Refill: 0 - nitrofurantoin, macrocrystal-monohydrate, (MACROBID) 100 MG capsule; Take 1 capsule (100 mg total) by mouth 2 (two) times daily for 5 days.  Dispense: 10 capsule; Refill: 0 -Continue to monitor symptoms for any change in severity if there is any escalation of current symptoms or development of new symptoms follow-up in ER for further evaluation and management.

## 2024-02-15 NOTE — ED Triage Notes (Signed)
 Pt c/o urinary freq x1 wk. States wondering if I might have a UTI - increased frequency, uncomfortable feeling. - Entered by patient

## 2024-02-15 NOTE — ED Provider Notes (Signed)
 [facility]  Note:  This document was prepared using Conservation officer, historic buildings and may include unintentional dictation errors.  MRN: [id number] DOB: [birth year]  Subjective:   Beverly Alvarez is a 63 y.o. female presenting for increased urinary frequency and suprapubic pressure x 1 week.  Patient denies any dysuria, fever, flank pain.  Patient reports past history of urinary tract infection but not in several months.  Denies any other secondary symptoms.  No current facility-administered medications for this encounter.  Current Outpatient Medications:    nitrofurantoin, macrocrystal-monohydrate, (MACROBID) 100 MG capsule, Take 1 capsule (100 mg total) by mouth 2 (two) times daily for 5 days., Disp: 10 capsule, Rfl: 0   omeprazole (PRILOSEC) 20 MG capsule, Take 1 capsule (20 mg total) by mouth once daily, Disp: , Rfl:    phenazopyridine (PYRIDIUM) 200 MG tablet, Take 1 tablet (200 mg total) by mouth 3 (three) times daily., Disp: 6 tablet, Rfl: 0   Ascorbic Acid (VITAMIN C) 1000 MG tablet, Take 1 tablet by mouth daily., Disp: , Rfl:    aspirin EC 81 MG tablet, Take 81 mg by mouth daily. , Disp: , Rfl:    baclofen  (LIORESAL ) 10 MG tablet, Take 0.5-1 tablets (5-10 mg total) by mouth 3 (three) times daily as needed for muscle spasms., Disp: 30 each, Rfl: 0   glucagon, human recombinant, (GLUCAGEN DIAGNOSTIC) 1 MG injection, Inject 1 mg into the muscle as needed., Disp: , Rfl:    insulin aspart (NOVOLOG) 100 UNIT/ML FlexPen, Inject 5-7 Units into the skin 3 (three) times daily before meals. + 1 unit/50 if BG>150, Disp: , Rfl:    insulin degludec (TRESIBA FLEXTOUCH) 100 UNIT/ML SOPN FlexTouch Pen, Inject 13 Units into the skin daily., Disp: , Rfl:    Multiple Vitamins-Minerals (MULTIVITAMIN ADULT PO), Take 1 tablet by mouth daily., Disp: , Rfl:    Omega-3 Fatty Acids (FISH OIL) 1000 MG CAPS, Take 1 capsule by mouth daily., Disp: , Rfl:    OTEZLA 30 MG TABS, Take 30 mg by mouth  2 (two) times daily. , Disp: , Rfl: 11   oxiconazole (OXISTAT) 1 % lotion, Apply 1 application topically as needed. , Disp: , Rfl:    rosuvastatin (CRESTOR) 5 MG tablet, Take 1 tablet by mouth daily., Disp: , Rfl:    triamterene-hydrochlorothiazide (MAXZIDE-25) 37.5-25 MG per tablet, Take 1 tablet by mouth daily. 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  Objective:   Vitals: BP (!) 182/93 (BP Location: Right Arm)   Pulse 74   Temp 98.2 F (36.8 C) (Oral)   Resp 16   Ht 5\' 2"  (1.575 m)   Wt 125 lb (56.7 kg)   SpO2 97%   BMI 22.86 kg/m   Physical Exam Vitals and nursing note reviewed.  Constitutional:      General: She is not in acute distress.    Appearance: Normal appearance. She is well-developed. She is not ill-appearing or toxic-appearing.  HENT:     Head: Normocephalic and atraumatic.  Cardiovascular:     Rate and Rhythm: Normal rate.  Pulmonary:     Effort: Pulmonary effort is normal. No  respiratory distress.  Abdominal:     Palpations: Abdomen is soft.     Tenderness: There is no abdominal tenderness. There is no right CVA tenderness or left CVA tenderness.  Skin:    General: Skin is warm and dry.  Neurological:     General: No focal deficit present.     Mental Status: She is alert and oriented to person, place, and time.  Psychiatric:        Mood and Affect: Mood normal.        Behavior: Behavior normal.     Procedures  Results for orders placed or performed during the hospital encounter of 02/15/24 (from the past 24 hours)  Urinalysis, w/ Reflex to Culture (Infection Suspected) -Urine, Clean Catch     Status: Abnormal   Collection Time: 02/15/24 10:11 AM  Result Value Ref Range   Specimen Source URINE, CLEAN CATCH    Color, Urine YELLOW YELLOW   APPearance CLEAR CLEAR   Specific Gravity, Urine 1.015 1.005 - 1.030   pH 7.0 5.0 - 8.0   Glucose, UA NEGATIVE NEGATIVE mg/dL   Hgb urine dipstick NEGATIVE NEGATIVE   Bilirubin Urine NEGATIVE NEGATIVE   Ketones, ur NEGATIVE NEGATIVE mg/dL   Protein, ur NEGATIVE NEGATIVE mg/dL   Nitrite NEGATIVE NEGATIVE   Leukocytes,Ua SMALL (A) NEGATIVE   Squamous Epithelial / HPF 0-5 0 - 5 /HPF   WBC, UA 0-5 0 - 5 WBC/hpf   RBC / HPF NONE SEEN 0 - 5 RBC/hpf   Bacteria, UA RARE (A) NONE SEEN    Assessment and Plan :     Discharge Instructions       1. Acute cystitis without hematuria (Primary) - Urinalysis completed in UC shows few bacteria and small bacteria, no blood, no nitrite.  These findings are possibly indicative of urinary tract infection - Urine Culture completed in UC and sent to lab, results should be available in 2 to 3 days. - phenazopyridine  (PYRIDIUM ) 200 MG tablet; Take 1 tablet (200 mg total) by mouth 3 (three) times daily.  Dispense: 6 tablet; Refill: 0 - nitrofurantoin , macrocrystal-monohydrate, (MACROBID ) 100 MG capsule; Take 1 capsule (100 mg total) by mouth 2 (two) times daily for 5 days.   Dispense: 10 capsule; Refill: 0 -Continue to monitor symptoms for any change in severity if there is any escalation of current symptoms or development of new symptoms follow-up in ER for further evaluation and management.    Beverly Alvarez Beverly Alvarez   Beverly Alvarez, [place] Alvarez, [place] 02/15/24 1108

## 2024-02-16 LAB — URINE CULTURE
Culture: NO GROWTH
Special Requests: NORMAL

## 2024-02-19 ENCOUNTER — Ambulatory Visit (HOSPITAL_COMMUNITY): Payer: Self-pay

## 2024-03-05 ENCOUNTER — Ambulatory Visit
Admission: RE | Admit: 2024-03-05 | Discharge: 2024-03-05 | Disposition: A | Discharge status: Home / Self Care | Payer: BC Managed Care – PPO | Source: Ambulatory Visit | Attending: Family Medicine | Admitting: Family Medicine

## 2024-03-05 DIAGNOSIS — Z1231 Encounter for screening mammogram for malignant neoplasm of breast: Secondary | ICD-10-CM | POA: Insufficient documentation

## 2024-09-10 ENCOUNTER — Other Ambulatory Visit: Payer: Self-pay | Admitting: Family Medicine

## 2024-09-10 DIAGNOSIS — Z780 Asymptomatic menopausal state: Secondary | ICD-10-CM

## 2024-10-24 ENCOUNTER — Encounter: Payer: Self-pay | Admitting: Family Medicine

## 2024-10-24 DIAGNOSIS — Z1231 Encounter for screening mammogram for malignant neoplasm of breast: Secondary | ICD-10-CM
